# Patient Record
Sex: Male | Born: 1960 | Race: White | Hispanic: No | Smoking: Current every day smoker
Health system: Southern US, Community
[De-identification: ages and names within clinical notes are randomized; demographics above are authoritative.]

## PROBLEM LIST (undated history)

## (undated) DIAGNOSIS — I10 Essential (primary) hypertension: Secondary | ICD-10-CM

## (undated) DIAGNOSIS — E7800 Pure hypercholesterolemia, unspecified: Secondary | ICD-10-CM

## (undated) DIAGNOSIS — I2510 Atherosclerotic heart disease of native coronary artery without angina pectoris: Secondary | ICD-10-CM

## (undated) DIAGNOSIS — M720 Palmar fascial fibromatosis [Dupuytren]: Secondary | ICD-10-CM

## (undated) DIAGNOSIS — F17200 Nicotine dependence, unspecified, uncomplicated: Secondary | ICD-10-CM

## (undated) DIAGNOSIS — J449 Chronic obstructive pulmonary disease, unspecified: Secondary | ICD-10-CM

## (undated) DIAGNOSIS — E538 Deficiency of other specified B group vitamins: Secondary | ICD-10-CM

## (undated) DIAGNOSIS — F1010 Alcohol abuse, uncomplicated: Secondary | ICD-10-CM

## (undated) DIAGNOSIS — I7090 Unspecified atherosclerosis: Secondary | ICD-10-CM

## (undated) DIAGNOSIS — N529 Male erectile dysfunction, unspecified: Secondary | ICD-10-CM

## (undated) DIAGNOSIS — G629 Polyneuropathy, unspecified: Secondary | ICD-10-CM

## (undated) DIAGNOSIS — Z860100 Personal history of colon polyps, unspecified: Secondary | ICD-10-CM

## (undated) DIAGNOSIS — J439 Emphysema, unspecified: Secondary | ICD-10-CM

## (undated) HISTORY — DX: Atherosclerotic heart disease of native coronary artery without angina pectoris: I25.10

## (undated) HISTORY — DX: Unspecified atherosclerosis: I70.90

## (undated) HISTORY — DX: Polyneuropathy, unspecified: G62.9

## (undated) HISTORY — DX: Nicotine dependence, unspecified, uncomplicated: F17.200

## (undated) HISTORY — PX: SPLENECTOMY, TOTAL: SHX788

## (undated) HISTORY — DX: Alcohol abuse, uncomplicated: F10.10

## (undated) HISTORY — DX: Personal history of colon polyps, unspecified: Z86.0100

## (undated) HISTORY — DX: Pure hypercholesterolemia, unspecified: E78.00

## (undated) HISTORY — DX: Male erectile dysfunction, unspecified: N52.9

## (undated) HISTORY — DX: Deficiency of other specified B group vitamins: E53.8

## (undated) HISTORY — DX: Emphysema, unspecified: J43.9

---

## 1999-11-24 ENCOUNTER — Encounter: Admission: RE | Admit: 1999-11-24 | Discharge: 1999-11-24 | Payer: Self-pay | Admitting: Family Medicine

## 1999-11-24 ENCOUNTER — Encounter: Payer: Self-pay | Admitting: Family Medicine

## 2011-06-30 ENCOUNTER — Other Ambulatory Visit: Payer: Self-pay | Admitting: Physician Assistant

## 2011-06-30 ENCOUNTER — Ambulatory Visit
Admission: RE | Admit: 2011-06-30 | Discharge: 2011-06-30 | Disposition: A | Discharge status: Home / Self Care | Payer: 59 | Source: Ambulatory Visit | Attending: Physician Assistant | Admitting: Physician Assistant

## 2011-06-30 DIAGNOSIS — R05 Cough: Secondary | ICD-10-CM

## 2011-06-30 DIAGNOSIS — R0602 Shortness of breath: Secondary | ICD-10-CM

## 2011-06-30 DIAGNOSIS — R059 Cough, unspecified: Secondary | ICD-10-CM

## 2011-07-14 ENCOUNTER — Ambulatory Visit
Admission: RE | Admit: 2011-07-14 | Discharge: 2011-07-14 | Disposition: A | Discharge status: Home / Self Care | Payer: 59 | Source: Ambulatory Visit | Attending: Physician Assistant | Admitting: Physician Assistant

## 2011-07-14 ENCOUNTER — Other Ambulatory Visit: Payer: Self-pay | Admitting: Physician Assistant

## 2011-07-14 DIAGNOSIS — R059 Cough, unspecified: Secondary | ICD-10-CM

## 2011-07-14 DIAGNOSIS — R05 Cough: Secondary | ICD-10-CM

## 2011-07-28 ENCOUNTER — Other Ambulatory Visit: Payer: Self-pay | Admitting: Physician Assistant

## 2011-07-28 ENCOUNTER — Ambulatory Visit
Admission: RE | Admit: 2011-07-28 | Discharge: 2011-07-28 | Disposition: A | Discharge status: Home / Self Care | Payer: 59 | Source: Ambulatory Visit | Attending: Physician Assistant | Admitting: Physician Assistant

## 2011-07-28 DIAGNOSIS — R0602 Shortness of breath: Secondary | ICD-10-CM

## 2016-02-25 DIAGNOSIS — I10 Essential (primary) hypertension: Secondary | ICD-10-CM | POA: Diagnosis not present

## 2016-02-25 DIAGNOSIS — Z720 Tobacco use: Secondary | ICD-10-CM | POA: Diagnosis not present

## 2016-05-08 DIAGNOSIS — R21 Rash and other nonspecific skin eruption: Secondary | ICD-10-CM | POA: Diagnosis not present

## 2016-05-26 DIAGNOSIS — E7800 Pure hypercholesterolemia, unspecified: Secondary | ICD-10-CM | POA: Diagnosis not present

## 2016-08-25 DIAGNOSIS — J449 Chronic obstructive pulmonary disease, unspecified: Secondary | ICD-10-CM | POA: Diagnosis not present

## 2016-08-25 DIAGNOSIS — Z125 Encounter for screening for malignant neoplasm of prostate: Secondary | ICD-10-CM | POA: Diagnosis not present

## 2016-08-25 DIAGNOSIS — I10 Essential (primary) hypertension: Secondary | ICD-10-CM | POA: Diagnosis not present

## 2016-08-25 DIAGNOSIS — Z0000 Encounter for general adult medical examination without abnormal findings: Secondary | ICD-10-CM | POA: Diagnosis not present

## 2016-08-25 DIAGNOSIS — E7800 Pure hypercholesterolemia, unspecified: Secondary | ICD-10-CM | POA: Diagnosis not present

## 2017-03-09 DIAGNOSIS — E7800 Pure hypercholesterolemia, unspecified: Secondary | ICD-10-CM | POA: Diagnosis not present

## 2017-03-09 DIAGNOSIS — J449 Chronic obstructive pulmonary disease, unspecified: Secondary | ICD-10-CM | POA: Diagnosis not present

## 2017-03-09 DIAGNOSIS — I10 Essential (primary) hypertension: Secondary | ICD-10-CM | POA: Diagnosis not present

## 2017-08-31 DIAGNOSIS — E7800 Pure hypercholesterolemia, unspecified: Secondary | ICD-10-CM | POA: Diagnosis not present

## 2017-08-31 DIAGNOSIS — Z125 Encounter for screening for malignant neoplasm of prostate: Secondary | ICD-10-CM | POA: Diagnosis not present

## 2017-08-31 DIAGNOSIS — I10 Essential (primary) hypertension: Secondary | ICD-10-CM | POA: Diagnosis not present

## 2017-08-31 DIAGNOSIS — Z0000 Encounter for general adult medical examination without abnormal findings: Secondary | ICD-10-CM | POA: Diagnosis not present

## 2017-09-01 ENCOUNTER — Encounter: Payer: Self-pay | Admitting: Emergency Medicine

## 2017-09-01 ENCOUNTER — Emergency Department
Admission: EM | Admit: 2017-09-01 | Discharge: 2017-09-01 | Disposition: A | Discharge status: Home / Self Care | Payer: Commercial Managed Care - PPO | Attending: Emergency Medicine | Admitting: Emergency Medicine

## 2017-09-01 ENCOUNTER — Other Ambulatory Visit: Payer: Self-pay

## 2017-09-01 DIAGNOSIS — R0602 Shortness of breath: Secondary | ICD-10-CM | POA: Diagnosis not present

## 2017-09-01 DIAGNOSIS — I10 Essential (primary) hypertension: Secondary | ICD-10-CM | POA: Diagnosis not present

## 2017-09-01 DIAGNOSIS — J449 Chronic obstructive pulmonary disease, unspecified: Secondary | ICD-10-CM | POA: Insufficient documentation

## 2017-09-01 DIAGNOSIS — T782XXA Anaphylactic shock, unspecified, initial encounter: Secondary | ICD-10-CM | POA: Diagnosis not present

## 2017-09-01 DIAGNOSIS — T63461A Toxic effect of venom of wasps, accidental (unintentional), initial encounter: Secondary | ICD-10-CM | POA: Diagnosis not present

## 2017-09-01 DIAGNOSIS — T7840XA Allergy, unspecified, initial encounter: Secondary | ICD-10-CM | POA: Diagnosis not present

## 2017-09-01 DIAGNOSIS — F17210 Nicotine dependence, cigarettes, uncomplicated: Secondary | ICD-10-CM | POA: Insufficient documentation

## 2017-09-01 HISTORY — DX: Chronic obstructive pulmonary disease, unspecified: J44.9

## 2017-09-01 HISTORY — DX: Essential (primary) hypertension: I10

## 2017-09-01 MED ORDER — SODIUM CHLORIDE 0.9 % IV BOLUS
1000.0000 mL | Freq: Once | INTRAVENOUS | Status: AC
  Administered 2017-09-01: 1000 mL via INTRAVENOUS

## 2017-09-01 MED ORDER — FAMOTIDINE 20 MG PO TABS: 20.0000 mg | ORAL_TABLET | Freq: Every day | ORAL | 0 refills | Status: DC

## 2017-09-01 MED ORDER — PREDNISONE 20 MG PO TABS: 60.0000 mg | ORAL_TABLET | Freq: Every day | ORAL | 0 refills | Status: AC

## 2017-09-01 MED ORDER — FAMOTIDINE IN NACL 20-0.9 MG/50ML-% IV SOLN
20.0000 mg | Freq: Once | INTRAVENOUS | Status: AC
  Administered 2017-09-01: 20 mg via INTRAVENOUS

## 2017-09-01 MED ORDER — EPINEPHRINE 0.3 MG/0.3ML IJ SOAJ: 0.3000 mg | Freq: Once | INTRAMUSCULAR | 1 refills | Status: AC

## 2017-09-01 NOTE — ED Provider Notes (Signed)
[facility] Emergency Department Provider Note  ____________________________________________  Time seen: Approximately 11:46 AM  I have reviewed the triage vital signs and the nursing notes.   HISTORY  Chief Complaint Allergic Reaction   HPI Timothy Terry Sr. is a 57 y.o. male for a history of COPD and hypertension who presents from urgent care for concerns of an anaphylactic reaction.  Patient was stung by 2 yellow jackets on his right arm and left ear this morning. He developed sudden onset of SOB and swelling of the ear and hives.  He was taken to urgent care.  Upon arrival patient was hemodynamically stable and he was given Decadron and Benadryl.  Right after that patient became hypotensive with systolic in the 60s and very lightheaded.  While patient was hypotensive he was noted to have asymmetry of his mouth and was drooling. He received epipen with improvement of his BP and was sent to the ED. Upon arrival, patient is very jittery, covered in hives, HD stable and neuro intact. He does have mild slurred speech. He denies throat closing sensation, tongue swelling, difficulty swallowing or breathing.   Past Medical History:  Diagnosis Date  . COPD (chronic obstructive pulmonary disease) (HCC)   . Hypertension     Past Surgical History:  Procedure Laterality Date  . SPLENECTOMY, TOTAL     at 57 years of age s/p fall from building    Prior to Admission medications   Medication Sig Start Date End Date Taking? Authorizing Provider  EPINEPHrine 0.3 mg/0.3 mL IJ SOAJ injection Inject 0.3 mLs (0.3 mg total) into the muscle once for 1 dose. 09/01/17 09/01/17  Nita Sickle, MD  famotidine (PEPCID) 20 MG tablet Take 1 tablet (20 mg total) by mouth daily for 4 days. 09/01/17 09/05/17  Nita Sickle, MD  predniSONE (DELTASONE) 20 MG tablet Take 3 tablets (60 mg total) by mouth daily for 4 days. 09/01/17 09/05/17  Nita Sickle, MD    Allergies Bee  venom  No family history on file.  Social History Social History   Tobacco Use  . Smoking status: Current Every Day Smoker    Packs/day: 1.00    Types: Cigarettes  . Smokeless tobacco: Never Used  Substance Use Topics  . Alcohol use: Yes    Alcohol/week: 7.2 oz    Types: 12 Cans of beer per week  . Drug use: Yes    Frequency: 1.0 times per week    Types: Marijuana    Review of Systems  Constitutional: Negative for fever. + dizziness Eyes: Negative for visual changes. ENT: Negative for sore throat. Neck: No neck pain  Cardiovascular: Negative for chest pain. Respiratory: + shortness of breath. Gastrointestinal: Negative for abdominal pain, vomiting or diarrhea. Genitourinary: Negative for dysuria. Musculoskeletal: Negative for back pain. Skin: + hives Neurological: Negative for headaches, weakness or numbness. Psych: No SI or HI  ____________________________________________   PHYSICAL EXAM:  VITAL SIGNS: Vitals:   09/01/17 1300 09/01/17 1330  BP: 123/83 129/83  Pulse: 75 73  Resp: 15 19  Temp:    SpO2: 98% 96%   Constitutional: Alert and oriented. Well appearing and in no apparent distress. HEENT:      Head: Normocephalic and atraumatic.         Eyes: Conjunctivae are normal. Sclera is non-icteric.       Mouth/Throat: Mucous membranes are moist.  Tongue is normal size with no swelling, uvula is midline with no swelling, there is no stridor, airways patent,  no angioedema      Ear: L pinna is swollen, ear canal is clear      Neck: Supple with no signs of meningismus. Cardiovascular: Regular rate and rhythm. No murmurs, gallops, or rubs. 2+ symmetrical distal pulses are present in all extremities. No JVD. Respiratory: Normal respiratory effort. Lungs are clear to auscultation bilaterally. No wheezes, crackles, or rhonchi.  Gastrointestinal: Soft, non tender, and non distended with positive bowel sounds. No rebound or guarding. Musculoskeletal: Nontender with  normal range of motion in all extremities. No edema, cyanosis, or erythema of extremities. Neurologic: Normal speech and language. Face is symmetric. Moving all extremities. No gross focal neurologic deficits are appreciated. Skin: Skin is warm, dry and intact. Diffuse hives on the torso Psychiatric: Mood and affect are normal. Speech and behavior are normal.  ____________________________________________   LABS (all labs ordered are listed, but only abnormal results are displayed)  Labs Reviewed - No data to display ____________________________________________  EKG  ED ECG REPORT I, Nita Sickle, the attending physician, personally viewed and interpreted this ECG.  Normal sinus rhythm with frequent PACs, rate of 85, normal intervals, normal axis, no ST elevations or depressions.  No prior for comparison. ____________________________________________  RADIOLOGY  none  ____________________________________________   PROCEDURES  Procedure(s) performed: None Procedures Critical Care performed:  None ____________________________________________   INITIAL IMPRESSION / ASSESSMENT AND PLAN / ED COURSE  57 y.o. male for a history of COPD and hypertension who presents from urgent care for concerns of an anaphylactic reaction.  Patient with severe anaphylactic reaction including hives, difficulty breathing, and hypotension.  Arrives hemodynamically stable after receiving EpiPen.  There was some concern the patient had drooping of his mouth and drooling when his blood pressure was low at urgent care however upon arrival to the emergency room patient is completely neurologically intact with no neuro deficits.  Symptoms most likely due to severe hypotension.  Will monitor patient closely.  At this time he only has hives.  There is no stridor, airway is patent, no difficulty breathing.  Telemetry showing irregular heart rhythm therefore an EKG was performed which shows frequent PACs again  most likely from epinephrine.  Patient has no chest pain.  No obvious ischemic changes.  Will give IV fluids, IV Pepcid and monitor patient closely.  Clinical Course as of Sep 01 1425  Sat Sep 01, 2017  1425 Patient was monitored for hours post EpiPen with no recurrence of his symptoms.  He remains extremely well-appearing with full resolution of his anaphylaxis.  Patient will be given a prescription for an EpiPen.  Discussed with him the importance of keeping an EpiPen with him at all times and discuss signs and symptoms to administer the EpiPen.  Patient will also be given a prescription for steroids for 4 more days.  Discussed return precautions for any recurrent signs of an allergic reaction.  Patient to follow-up with his primary care doctor in 2 to 3 days.   [CV]    Clinical Course User Index [CV] Don Perking [place], MD     As part of my medical decision making, I reviewed the following data within the electronic MEDICAL RECORD NUMBER Nursing notes reviewed and incorporated, EKG interpreted , Notes from prior ED visits and [place] Controlled Substance Database    Pertinent labs & imaging results that were available during my care of the patient were reviewed by me and considered in my medical decision making (see chart for details).    ____________________________________________   FINAL CLINICAL  IMPRESSION(S) / ED DIAGNOSES  Final diagnoses:  Anaphylaxis, initial encounter      NEW MEDICATIONS STARTED DURING THIS VISIT:  ED Discharge Orders        Ordered    predniSONE (DELTASONE) 20 MG tablet  Daily     09/01/17 1426    famotidine (PEPCID) 20 MG tablet  Daily     09/01/17 1426    EPINEPHrine 0.3 mg/0.3 mL IJ SOAJ injection   Once     09/01/17 1427       Note:  This document was prepared using Dragon voice recognition software and may include unintentional dictation errors.    Don PerkingVeronese, [place], MD 09/01/17 [id number]

## 2017-09-01 NOTE — ED Notes (Signed)
Pt ambulatory upon discharge; declined wheel chair. Verbalized understanding of discharge instructions, follow-up care and prescriptions. VSS. Skin warm and dry. A&O x4.  

## 2017-09-01 NOTE — ED Notes (Signed)
ED Provider at bedside. 

## 2017-09-01 NOTE — ED Triage Notes (Addendum)
[facility] s/p getting stung by yellow jackets (one on top of left arm and right arm). Started having hives and breathing difficulty there. Pt's BP dropped to 60s systollically. Left ear purple and very swollen at this time. Pt received 25 mg benedryl, 10 mg decadron & epi pen at urgent care. Pt A&O at this time.  Pt having slurred speech at this time. Denies any swelling feeling in throat or tongue. Pt left side of mouth is drooping. Smile asymmetrical. Grip strength strong and equal bilateral. Urgent care reported left arm weakness.   Abdominal rash present.

## 2017-10-15 DIAGNOSIS — R05 Cough: Secondary | ICD-10-CM | POA: Diagnosis not present

## 2017-10-15 DIAGNOSIS — R29810 Facial weakness: Secondary | ICD-10-CM | POA: Diagnosis not present

## 2017-10-15 DIAGNOSIS — F17200 Nicotine dependence, unspecified, uncomplicated: Secondary | ICD-10-CM | POA: Diagnosis not present

## 2017-10-15 DIAGNOSIS — J449 Chronic obstructive pulmonary disease, unspecified: Secondary | ICD-10-CM | POA: Diagnosis not present

## 2017-10-15 DIAGNOSIS — G510 Bell's palsy: Secondary | ICD-10-CM | POA: Diagnosis not present

## 2017-11-22 DIAGNOSIS — S61512A Laceration without foreign body of left wrist, initial encounter: Secondary | ICD-10-CM | POA: Diagnosis not present

## 2017-11-22 DIAGNOSIS — W268XXA Contact with other sharp object(s), not elsewhere classified, initial encounter: Secondary | ICD-10-CM | POA: Diagnosis not present

## 2017-11-22 DIAGNOSIS — Z23 Encounter for immunization: Secondary | ICD-10-CM | POA: Diagnosis not present

## 2017-11-29 DIAGNOSIS — Z4802 Encounter for removal of sutures: Secondary | ICD-10-CM | POA: Diagnosis not present

## 2018-03-29 DIAGNOSIS — J449 Chronic obstructive pulmonary disease, unspecified: Secondary | ICD-10-CM | POA: Diagnosis not present

## 2018-03-29 DIAGNOSIS — I10 Essential (primary) hypertension: Secondary | ICD-10-CM | POA: Diagnosis not present

## 2018-03-29 DIAGNOSIS — E7800 Pure hypercholesterolemia, unspecified: Secondary | ICD-10-CM | POA: Diagnosis not present

## 2018-04-06 DIAGNOSIS — J449 Chronic obstructive pulmonary disease, unspecified: Secondary | ICD-10-CM | POA: Diagnosis not present

## 2018-04-06 DIAGNOSIS — J9801 Acute bronchospasm: Secondary | ICD-10-CM | POA: Diagnosis not present

## 2018-04-06 DIAGNOSIS — J209 Acute bronchitis, unspecified: Secondary | ICD-10-CM | POA: Diagnosis not present

## 2019-06-02 ENCOUNTER — Telehealth: Payer: Self-pay | Admitting: *Deleted

## 2019-06-06 ENCOUNTER — Other Ambulatory Visit: Payer: Self-pay | Admitting: *Deleted

## 2019-06-06 DIAGNOSIS — F17210 Nicotine dependence, cigarettes, uncomplicated: Secondary | ICD-10-CM

## 2019-06-06 NOTE — Telephone Encounter (Signed)
Spoke with pt and scheduled SDMV 06/20/19 1:30 CT ordered Nothing further needed

## 2019-06-06 NOTE — Telephone Encounter (Signed)
Timothy Terry have you spoke with this patient?

## 2019-06-06 NOTE — Telephone Encounter (Signed)
LMTC x 1 to discuss lung screening

## 2019-06-20 ENCOUNTER — Other Ambulatory Visit: Payer: Self-pay

## 2019-06-20 ENCOUNTER — Ambulatory Visit (INDEPENDENT_AMBULATORY_CARE_PROVIDER_SITE_OTHER)
Admission: RE | Admit: 2019-06-20 | Discharge: 2019-06-20 | Disposition: A | Discharge status: Home / Self Care | Payer: Commercial Managed Care - PPO | Source: Ambulatory Visit | Attending: Acute Care | Admitting: Acute Care

## 2019-06-20 ENCOUNTER — Encounter: Payer: Self-pay | Admitting: Primary Care

## 2019-06-20 ENCOUNTER — Ambulatory Visit (INDEPENDENT_AMBULATORY_CARE_PROVIDER_SITE_OTHER): Payer: Commercial Managed Care - PPO | Admitting: Primary Care

## 2019-06-20 VITALS — BP 142/82 | HR 86 | Temp 98.6°F | Ht 76.0 in | Wt 213.4 lb

## 2019-06-20 DIAGNOSIS — Z87891 Personal history of nicotine dependence: Secondary | ICD-10-CM | POA: Diagnosis not present

## 2019-06-20 DIAGNOSIS — F17200 Nicotine dependence, unspecified, uncomplicated: Secondary | ICD-10-CM

## 2019-06-20 DIAGNOSIS — F17210 Nicotine dependence, cigarettes, uncomplicated: Secondary | ICD-10-CM

## 2019-06-20 NOTE — Progress Notes (Signed)
Shared Decision Making Visit Lung Cancer Screening Program [phone])   Eligibility:  Age 60 y.o.  Pack Years Smoking History Calculation 45 (# packs/per year x # years smoked)  Recent History of coughing up blood  no  Unexplained weight loss? no ( >Than 15 pounds within the last 6 months )  Prior History Lung / other cancer no (Diagnosis within the last 5 years already requiring surveillance chest CT Scans).  Smoking Status Current Smoker  Visit Components:  Discussion included one or more decision making aids. yes  Discussion included risk/benefits of screening. yes  Discussion included potential follow up diagnostic testing for abnormal scans. yes  Discussion included meaning and risk of over diagnosis. yes  Discussion included meaning and risk of False Positives. yes  Discussion included meaning of total radiation exposure. yes  Counseling Included:  Importance of adherence to annual lung cancer LDCT screening. yes  Impact of comorbidities on ability to participate in the program. yes  Ability and willingness to under diagnostic treatment. yes  Smoking Cessation Counseling:  Current Smokers:   Discussed importance of smoking cessation. yes  Information about tobacco cessation classes and interventions provided to patient. yes  Patient provided with "ticket" for LDCT Scan. yes  Symptomatic Patient. yes  Counseling(Intermediate counseling: > three minutes) [id number]  Diagnosis Code: Tobacco Use Z72.0  Asymptomatic Patient yes  Counseling (Intermediate counseling: > three minutes counseling) M5465  Former Smokers:   Discussed the importance of maintaining cigarette abstinence. yes  Diagnosis Code: Personal History of Nicotine Dependence. K35.465  Information about tobacco cessation classes and interventions provided to patient. Yes  Patient provided with "ticket" for LDCT Scan. yes  Written Order for Lung Cancer Screening with LDCT placed in Epic.  Yes (CT Chest Lung Cancer Screening Low Dose W/O CM) [id number] Z12.2-Screening of respiratory organs Z87.891-Personal history of nicotine dependence   BP (!) 142/82 (BP Location: Right Arm, Cuff Size: Normal)   Pulse 86   Temp 98.6 F (37 C) (Temporal)   Ht 6\' 4"  (1.93 m)   Wt 213 lb 6.4 oz (96.8 kg)   SpO2 96%   BMI 25.98 kg/m    I have spent 25 minutes of face to face time with Mr. Morken discussing the risks and benefits of lung cancer screening. We viewed a power point together that explained in detail the above noted topics. We paused at intervals to allow for questions to be asked and answered to ensure understanding.We discussed that the single most powerful action that he can take to decrease his risk of developing lung cancer is to quit smoking. We discussed whether or not he is ready to commit to setting a quit date. We discussed options for tools to aid in quitting smoking including nicotine replacement therapy, non-nicotine medications, support groups, Quit Smart classes, and behavior modification. We discussed that often times setting smaller, more achievable goals, such as eliminating 1 cigarette a day for a week and then 2 cigarettes a day for a week can be helpful in slowly decreasing the number of cigarettes smoked. This allows for a sense of accomplishment as well as providing a clinical benefit. I gave him the " Be Stronger Than Your Excuses" card with contact information for community resources, classes, free nicotine replacement therapy, and access to mobile apps, text messaging, and on-line smoking cessation help. I have also given him Jeanella Anton card and contact information in the event he needs to contact me. We discussed the time and location of the scan, and  that either Doroteo Glassman RN or I will call with the results within 24-48 hours of receiving them. I have offered him  a copy of the power point we viewed  as a resource in the event they need reinforcement of the  concepts we discussed today in the office. The patient verbalized understanding of all of  the above and had no further questions upon leaving the office. They have my contact information in the event they have any further questions.  I spent 3-5 minutes counseling on smoking cessation and the health risks of continued tobacco abuse.  I explained to the patient that there has been a high incidence of coronary artery disease noted on these exams. I explained that this is a non-gated exam therefore degree or severity cannot be determined. This patient is on statin therapy. I have asked the patient to follow-up with their PCP regarding any incidental finding of coronary artery disease and management with diet or medication as their PCP  feels is clinically indicated. The patient verbalized understanding of the above and had no further questions upon completion of the visit.  Current tobacco smoker and daily marijuana use. No vaping. Family hx of lung cancer. He is on cholesterol medication. He has a history of a collapse lung at 46 from a fall and pneumonia age 1.   Martyn Ehrich, NP

## 2019-06-20 NOTE — Patient Instructions (Addendum)
Thank you for participating in the [facility] Cancer Screening Program. It was our pleasure to meet you today. We will call you with the results of your scan within the next few days. Your scan will be assigned a Lung RADS category score by the physicians reading the scans.  This Lung RADS score determines follow up scanning.  See below for description of categories, and follow up screening recommendations. We will be in touch to schedule your follow up screening annually or based on recommendations of our providers. We will fax a copy of your scan results to your Primary Care Physician, or the physician who referred you to the program, to ensure they have the results. Please call the office if you have any questions or concerns regarding your scanning experience or results.  Our office number is [phone]. Please speak with Denise Phelps, RN. She is our Lung Cancer Screening RN. If she is unavailable when you call, please have the office staff send her a message. She will return your call at her earliest convenience. Remember, if your scan is normal, we will scan you annually as long as you continue to meet the criteria for the program. (Age 55-77, Current smoker or smoker who has quit within the last 15 years). If you are a smoker, remember, quitting is the single most powerful action that you can take to decrease your risk of lung cancer and other pulmonary, breathing related problems. We know quitting is hard, and we are here to help.  Please let us know if there is anything we can do to help you meet your goal of quitting. If you are a former smoker, congratulations. We are proud of you! Remain smoke free! Remember you can refer friends or family members through the number above.  We will screen them to make sure they meet criteria for the program. Thank you for helping us take better care of you by participating in Lung Screening.  Lung RADS Categories:  Lung RADS 1: no nodules  or definitely non-concerning nodules.  Recommendation is for a repeat annual scan in 12 months.  Lung RADS 2:  nodules that are non-concerning in appearance and behavior with a very low likelihood of becoming an active cancer. Recommendation is for a repeat annual scan in 12 months.  Lung RADS 3: nodules that are probably non-concerning , includes nodules with a low likelihood of becoming an active cancer.  Recommendation is for a 6-month repeat screening scan. Often noted after an upper respiratory illness. We will be in touch to make sure you have no questions, and to schedule your 6-month scan.  Lung RADS 4 A: nodules with concerning findings, recommendation is most often for a follow up scan in 3 months or additional testing based on our provider's assessment of the scan. We will be in touch to make sure you have no questions and to schedule the recommended 3 month follow up scan.  Lung RADS 4 B:  indicates findings that are concerning. We will be in touch with you to schedule additional diagnostic testing based on our provider's  assessment of the scan.   

## 2019-06-23 ENCOUNTER — Telehealth: Payer: Self-pay | Admitting: Acute Care

## 2019-06-23 NOTE — Telephone Encounter (Signed)
This will get called through the lung cancer screening program. Thanks so much

## 2019-06-23 NOTE — Telephone Encounter (Signed)
Received call report from [facility] with [facility] on patient's Low dose CT done on 06/20/19 . SG please review the result/impression copied below:  IMPRESSION: 1. Nodular lesion in the apical left upper lobe. Scarring is strongly favored. By size criteria and because malignancy cannot be definitively excluded, lesion is characterized as Lung-RADS 4A, suspicious. Follow up low-dose chest CT without contrast in 3 months (please use the following order, "CT CHEST LCS NODULE FOLLOW-UP W/O CM") is recommended. Alternatively, PET may be considered when there is a solid component 75mm or larger. These results will be called to the ordering clinician or representative by the Radiologist Assistant, and communication documented in the PACS or [employer]. 2. Aortic atherosclerosis (ICD10-I70.0). Coronary artery calcification. 3.  Emphysema (ICD10-J43.9).    Please advise, thank you.

## 2019-06-26 ENCOUNTER — Other Ambulatory Visit: Payer: Self-pay | Admitting: *Deleted

## 2019-06-26 DIAGNOSIS — F17210 Nicotine dependence, cigarettes, uncomplicated: Secondary | ICD-10-CM

## 2019-06-26 NOTE — Progress Notes (Signed)
I called to give the patient the results of his low dose CT. There was no answer. I have left a HIPPA compliant message with the office contact number and I have requested that the patient call to discuss his low dose CT results.   Denise please place order for 3 month follow up low dose CT and fax results to PCP. Let them know we are doing a 3 month follow up.

## 2019-07-02 ENCOUNTER — Telehealth: Payer: Self-pay | Admitting: Acute Care

## 2019-07-02 NOTE — Telephone Encounter (Signed)
Called and spoke with patient. He was called back in regards to his LCS results. Will route to the LCS pool.

## 2019-07-03 NOTE — Telephone Encounter (Signed)
I have called the patient with the results of his LDCT. I explained that his scan had been read as a Lung RADS 4 A : suspicious findings, either short term follow up in 3 months or alternatively  PET Scan evaluation may be considered when there is a solid component of  8 mm or larger.I also explained that scarring is strongly favored. The patient did have a significant pneumonia in the past, and had been told he had scarring as a result. I explained that we will do a 3 month follow up scan to ensure stability to support scarring as the diagnosis. He is in agreement with this. We will do a follow up scan 09/2019. I told him we will call closer to the time to get him scheduled. He verbalized understanding.  Angelique Blonder, please fax results to PCP and order 3 month follow up scan for 09/2019.  Synetta Fail, I wanted you to know the follow up date.  Thanks everyone for all you do!!

## 2019-07-03 NOTE — Telephone Encounter (Signed)
Results were faxed to PCP. Order was placed for 3 mth f/u CT.

## 2019-07-03 NOTE — Telephone Encounter (Signed)
Timothy Terry, pt has called back regarding CT results.

## 2019-07-07 NOTE — Progress Notes (Signed)
I attempted to call the patient with the results of his LDCT again today. There was no answer. We will attempt to call the patient again this week if he does not respond to our previous telephone message to call the office.

## 2019-07-10 NOTE — Progress Notes (Signed)
I have called the patient with the results. I have explained that we will do a repeat CT in 3 months to ensure stability of the nodule. He verbalized understanding and is in agreement with the plan. Angelique Blonder, I think you have already placed the order for the 3 month follow up. Please fax results to PCP if you have not already done so. Thanks so much

## 2019-07-28 ENCOUNTER — Other Ambulatory Visit: Payer: Self-pay | Admitting: Orthopedic Surgery

## 2019-08-18 ENCOUNTER — Encounter (HOSPITAL_BASED_OUTPATIENT_CLINIC_OR_DEPARTMENT_OTHER): Payer: Self-pay | Admitting: Orthopedic Surgery

## 2019-08-18 ENCOUNTER — Other Ambulatory Visit: Payer: Self-pay

## 2019-08-26 ENCOUNTER — Ambulatory Visit (HOSPITAL_BASED_OUTPATIENT_CLINIC_OR_DEPARTMENT_OTHER)
Admission: RE | Admit: 2019-08-26 | Payer: Commercial Managed Care - PPO | Source: Home / Self Care | Admitting: Orthopedic Surgery

## 2019-08-26 HISTORY — DX: Nicotine dependence, unspecified, uncomplicated: F17.200

## 2019-08-26 HISTORY — DX: Palmar fascial fibromatosis (dupuytren): M72.0

## 2019-08-26 SURGERY — RELEASE, DUPUYTREN CONTRACTURE
Anesthesia: Choice | Laterality: Right

## 2019-09-26 ENCOUNTER — Ambulatory Visit (INDEPENDENT_AMBULATORY_CARE_PROVIDER_SITE_OTHER)
Admission: RE | Admit: 2019-09-26 | Discharge: 2019-09-26 | Disposition: A | Discharge status: Home / Self Care | Payer: Commercial Managed Care - PPO | Source: Ambulatory Visit | Attending: Acute Care | Admitting: Acute Care

## 2019-09-26 ENCOUNTER — Other Ambulatory Visit: Payer: Self-pay

## 2019-09-26 DIAGNOSIS — F17210 Nicotine dependence, cigarettes, uncomplicated: Secondary | ICD-10-CM | POA: Diagnosis not present

## 2019-09-26 NOTE — Progress Notes (Signed)
Please call patient and let them  know their  low dose Ct was read as a Lung RADS 2: nodules that are benign in appearance and behavior with a very low likelihood of becoming a clinically active cancer due to size or lack of growth. Recommendation per radiology is for a repeat LDCT in 12 months. .Please let them  know we will order and schedule their  annual screening scan for 09/2020. Please let them  know there was notation of CAD on their  scan.  Please remind the patient  that this is a non-gated exam therefore degree or severity of disease  cannot be determined. Please have them  follow up with their PCP regarding potential risk factor modification, dietary therapy or pharmacologic therapy if clinically indicated. Pt.  is  currently on statin therapy. Please place order for annual  screening scan for  09/2020 and fax results to PCP. Thanks so much. 

## 2019-09-29 ENCOUNTER — Other Ambulatory Visit: Payer: Self-pay | Admitting: *Deleted

## 2019-09-29 DIAGNOSIS — F17210 Nicotine dependence, cigarettes, uncomplicated: Secondary | ICD-10-CM

## 2020-01-07 ENCOUNTER — Other Ambulatory Visit: Payer: Self-pay | Admitting: Orthopedic Surgery

## 2020-02-04 ENCOUNTER — Other Ambulatory Visit: Payer: Self-pay

## 2020-02-04 ENCOUNTER — Encounter (HOSPITAL_BASED_OUTPATIENT_CLINIC_OR_DEPARTMENT_OTHER): Payer: Self-pay | Admitting: Orthopedic Surgery

## 2020-02-10 ENCOUNTER — Encounter (HOSPITAL_BASED_OUTPATIENT_CLINIC_OR_DEPARTMENT_OTHER)
Admission: RE | Admit: 2020-02-10 | Discharge: 2020-02-10 | Disposition: A | Discharge status: Home / Self Care | Payer: Commercial Managed Care - PPO | Source: Ambulatory Visit | Attending: Orthopedic Surgery | Admitting: Orthopedic Surgery

## 2020-02-10 ENCOUNTER — Other Ambulatory Visit (HOSPITAL_COMMUNITY)
Admission: RE | Admit: 2020-02-10 | Discharge: 2020-02-10 | Disposition: A | Discharge status: Home / Self Care | Payer: Commercial Managed Care - PPO | Source: Ambulatory Visit | Attending: Orthopedic Surgery | Admitting: Orthopedic Surgery

## 2020-02-10 DIAGNOSIS — Z20822 Contact with and (suspected) exposure to covid-19: Secondary | ICD-10-CM | POA: Diagnosis not present

## 2020-02-10 DIAGNOSIS — Z01818 Encounter for other preprocedural examination: Secondary | ICD-10-CM | POA: Insufficient documentation

## 2020-02-10 DIAGNOSIS — Z01812 Encounter for preprocedural laboratory examination: Secondary | ICD-10-CM | POA: Insufficient documentation

## 2020-02-10 LAB — SARS CORONAVIRUS 2 (TAT 6-24 HRS): SARS Coronavirus 2: NEGATIVE

## 2020-02-10 LAB — BASIC METABOLIC PANEL
Anion gap: 8 (ref 5–15)
BUN: 8 mg/dL (ref 6–20)
CO2: 28 mmol/L (ref 22–32)
Calcium: 9.7 mg/dL (ref 8.9–10.3)
Chloride: 102 mmol/L (ref 98–111)
Creatinine, Ser: 1.02 mg/dL (ref 0.61–1.24)
GFR, Estimated: >60 mL/min (ref >60)
Glucose, Bld: 109 mg/dL — ABNORMAL HIGH (ref 70–99)
Potassium: 4.4 mmol/L (ref 3.5–5.1)
Sodium: 138 mmol/L (ref 135–145)

## 2020-02-10 NOTE — Progress Notes (Signed)

## 2020-02-13 ENCOUNTER — Ambulatory Visit (HOSPITAL_BASED_OUTPATIENT_CLINIC_OR_DEPARTMENT_OTHER): Payer: Commercial Managed Care - PPO | Admitting: Anesthesiology

## 2020-02-13 ENCOUNTER — Encounter (HOSPITAL_BASED_OUTPATIENT_CLINIC_OR_DEPARTMENT_OTHER)
Admission: RE | Disposition: A | Discharge status: Home / Self Care | Payer: Self-pay | Source: Home / Self Care | Attending: Orthopedic Surgery

## 2020-02-13 ENCOUNTER — Ambulatory Visit (HOSPITAL_BASED_OUTPATIENT_CLINIC_OR_DEPARTMENT_OTHER)
Admission: RE | Admit: 2020-02-13 | Discharge: 2020-02-13 | Disposition: A | Discharge status: Home / Self Care | Payer: Commercial Managed Care - PPO | Attending: Orthopedic Surgery | Admitting: Orthopedic Surgery

## 2020-02-13 ENCOUNTER — Other Ambulatory Visit: Payer: Self-pay

## 2020-02-13 ENCOUNTER — Encounter (HOSPITAL_BASED_OUTPATIENT_CLINIC_OR_DEPARTMENT_OTHER): Payer: Self-pay | Admitting: Orthopedic Surgery

## 2020-02-13 DIAGNOSIS — M720 Palmar fascial fibromatosis [Dupuytren]: Secondary | ICD-10-CM | POA: Insufficient documentation

## 2020-02-13 DIAGNOSIS — J449 Chronic obstructive pulmonary disease, unspecified: Secondary | ICD-10-CM | POA: Diagnosis not present

## 2020-02-13 HISTORY — PX: DUPUYTREN CONTRACTURE RELEASE: SHX1478

## 2020-02-13 SURGERY — RELEASE, DUPUYTREN CONTRACTURE
Anesthesia: Regional | Site: Hand | Laterality: Right

## 2020-02-13 MED ORDER — BUPIVACAINE-EPINEPHRINE (PF) 0.5% -1:200000 IJ SOLN
INTRAMUSCULAR | Status: DC | PRN
  Administered 2020-02-13: 30 mL via PERINEURAL

## 2020-02-13 MED ORDER — TRAMADOL HCL 50 MG PO TABS: 50.0000 mg | ORAL_TABLET | Freq: Four times a day (QID) | ORAL | 0 refills | Status: DC | PRN

## 2020-02-13 MED ORDER — MIDAZOLAM HCL 2 MG/2ML IJ SOLN
INTRAMUSCULAR | Status: AC
  Filled 2020-02-13: qty 2

## 2020-02-13 MED ORDER — FENTANYL CITRATE (PF) 100 MCG/2ML IJ SOLN
100.0000 ug | Freq: Once | INTRAMUSCULAR | Status: AC
  Administered 2020-02-13 (×2): 50 ug via INTRAVENOUS

## 2020-02-13 MED ORDER — ONDANSETRON HCL 4 MG/2ML IJ SOLN
INTRAMUSCULAR | Status: AC
  Filled 2020-02-13: qty 2

## 2020-02-13 MED ORDER — LACTATED RINGERS IV SOLN: INTRAVENOUS | Status: DC

## 2020-02-13 MED ORDER — THROMBIN 5000 UNITS EX SOLR
CUTANEOUS | Status: AC
  Filled 2020-02-13: qty 5000

## 2020-02-13 MED ORDER — FENTANYL CITRATE (PF) 100 MCG/2ML IJ SOLN
INTRAMUSCULAR | Status: AC
  Filled 2020-02-13: qty 2

## 2020-02-13 MED ORDER — PROPOFOL 500 MG/50ML IV EMUL
INTRAVENOUS | Status: AC
  Filled 2020-02-13: qty 50

## 2020-02-13 MED ORDER — GLYCOPYRROLATE 0.2 MG/ML IJ SOLN
INTRAMUSCULAR | Status: DC | PRN
  Administered 2020-02-13: .2 mg via INTRAVENOUS

## 2020-02-13 MED ORDER — ONDANSETRON HCL 4 MG/2ML IJ SOLN
INTRAMUSCULAR | Status: DC | PRN
  Administered 2020-02-13: 4 mg via INTRAVENOUS

## 2020-02-13 MED ORDER — GLYCOPYRROLATE PF 0.2 MG/ML IJ SOSY
PREFILLED_SYRINGE | INTRAMUSCULAR | Status: AC
  Filled 2020-02-13: qty 1

## 2020-02-13 MED ORDER — THROMBIN 5000 UNITS EX SOLR
CUTANEOUS | Status: DC | PRN
  Administered 2020-02-13: 5000 [IU] via TOPICAL

## 2020-02-13 MED ORDER — CEFAZOLIN SODIUM-DEXTROSE 2-4 GM/100ML-% IV SOLN
INTRAVENOUS | Status: AC
  Filled 2020-02-13: qty 100

## 2020-02-13 MED ORDER — PROPOFOL 500 MG/50ML IV EMUL
INTRAVENOUS | Status: DC | PRN
  Administered 2020-02-13 (×2): 150 ug/kg/min via INTRAVENOUS
  Administered 2020-02-13: 100 ug/kg/min via INTRAVENOUS

## 2020-02-13 MED ORDER — MIDAZOLAM HCL 2 MG/2ML IJ SOLN
2.0000 mg | Freq: Once | INTRAMUSCULAR | Status: AC
  Administered 2020-02-13: 2 mg via INTRAVENOUS

## 2020-02-13 MED ORDER — PROPOFOL 10 MG/ML IV BOLUS
INTRAVENOUS | Status: DC | PRN
  Administered 2020-02-13: 10 mg via INTRAVENOUS
  Administered 2020-02-13: 20 mg via INTRAVENOUS
  Administered 2020-02-13 (×4): 10 mg via INTRAVENOUS

## 2020-02-13 MED ORDER — FENTANYL CITRATE (PF) 100 MCG/2ML IJ SOLN
INTRAMUSCULAR | Status: DC | PRN
  Administered 2020-02-13: 50 ug via INTRAVENOUS

## 2020-02-13 MED ORDER — CEFAZOLIN SODIUM-DEXTROSE 2-4 GM/100ML-% IV SOLN
2.0000 g | INTRAVENOUS | Status: AC
  Administered 2020-02-13: 2 g via INTRAVENOUS

## 2020-02-13 MED ORDER — LIDOCAINE 2% (20 MG/ML) 5 ML SYRINGE
INTRAMUSCULAR | Status: AC
  Filled 2020-02-13: qty 5

## 2020-02-13 MED ORDER — LIDOCAINE HCL (CARDIAC) PF 100 MG/5ML IV SOSY
PREFILLED_SYRINGE | INTRAVENOUS | Status: DC | PRN
  Administered 2020-02-13: 40 mg via INTRAVENOUS

## 2020-02-13 MED ORDER — DEXAMETHASONE SODIUM PHOSPHATE 10 MG/ML IJ SOLN
INTRAMUSCULAR | Status: AC
  Filled 2020-02-13: qty 1

## 2020-02-13 SURGICAL SUPPLY — 54 items
APL PRP STRL LF DISP 70% ISPRP (MISCELLANEOUS) ×1
BLADE MINI RND TIP GREEN BEAV (BLADE) ×1 IMPLANT
BLADE SURG 15 STRL LF DISP TIS (BLADE) ×1 IMPLANT
BLADE SURG 15 STRL SS (BLADE) ×2
BNDG CMPR 9X4 STRL LF SNTH (GAUZE/BANDAGES/DRESSINGS) ×1
BNDG COHESIVE 3X5 TAN STRL LF (GAUZE/BANDAGES/DRESSINGS) ×2 IMPLANT
BNDG ESMARK 4X9 LF (GAUZE/BANDAGES/DRESSINGS) ×2 IMPLANT
BNDG GAUZE ELAST 4 BULKY (GAUZE/BANDAGES/DRESSINGS) ×2 IMPLANT
CHLORAPREP W/TINT 26 (MISCELLANEOUS) ×2 IMPLANT
CORD BIPOLAR FORCEPS 12FT (ELECTRODE) ×2 IMPLANT
COVER BACK TABLE 60X90IN (DRAPES) ×2 IMPLANT
COVER MAYO STAND STRL (DRAPES) ×2 IMPLANT
COVER WAND RF STERILE (DRAPES) IMPLANT
CUFF TOURN SGL QUICK 18X4 (TOURNIQUET CUFF) IMPLANT
DECANTER SPIKE VIAL GLASS SM (MISCELLANEOUS) IMPLANT
DRAPE EXTREMITY T 121X128X90 (DISPOSABLE) ×2 IMPLANT
DRAPE SURG 17X23 STRL (DRAPES) ×2 IMPLANT
GAUZE SPONGE 4X4 12PLY STRL (GAUZE/BANDAGES/DRESSINGS) ×2 IMPLANT
GAUZE XEROFORM 1X8 LF (GAUZE/BANDAGES/DRESSINGS) ×2 IMPLANT
GLOVE BIO SURGEON STRL SZ7.5 (GLOVE) ×1 IMPLANT
GLOVE BIO SURGEON STRL SZ8 (GLOVE) ×2 IMPLANT
GLOVE BIOGEL PI IND STRL 7.5 (GLOVE) IMPLANT
GLOVE BIOGEL PI IND STRL 8.5 (GLOVE) ×1 IMPLANT
GLOVE BIOGEL PI INDICATOR 7.5 (GLOVE) ×1
GLOVE BIOGEL PI INDICATOR 8.5 (GLOVE) ×1
GLOVE SURG ORTHO 8.0 STRL STRW (GLOVE) ×2 IMPLANT
GLOVE SURG SYN 7.5  E (GLOVE) ×2
GLOVE SURG SYN 7.5 E (GLOVE) ×1 IMPLANT
GLOVE SURG SYN 7.5 PF PI (GLOVE) IMPLANT
GOWN STRL REUS W/ TWL LRG LVL3 (GOWN DISPOSABLE) ×1 IMPLANT
GOWN STRL REUS W/ TWL XL LVL3 (GOWN DISPOSABLE) IMPLANT
GOWN STRL REUS W/TWL 2XL LVL3 (GOWN DISPOSABLE) ×1 IMPLANT
GOWN STRL REUS W/TWL LRG LVL3 (GOWN DISPOSABLE)
GOWN STRL REUS W/TWL XL LVL3 (GOWN DISPOSABLE) ×5 IMPLANT
LOOP VESSEL MAXI BLUE (MISCELLANEOUS) ×2 IMPLANT
NS IRRIG 1000ML POUR BTL (IV SOLUTION) ×2 IMPLANT
PACK BASIN DAY SURGERY FS (CUSTOM PROCEDURE TRAY) ×2 IMPLANT
PAD CAST 3X4 CTTN HI CHSV (CAST SUPPLIES) ×1 IMPLANT
PADDING CAST ABS 3INX4YD NS (CAST SUPPLIES)
PADDING CAST ABS 4INX4YD NS (CAST SUPPLIES) ×1
PADDING CAST ABS COTTON 3X4 (CAST SUPPLIES) IMPLANT
PADDING CAST ABS COTTON 4X4 ST (CAST SUPPLIES) ×1 IMPLANT
PADDING CAST COTTON 3X4 STRL (CAST SUPPLIES) ×2
SLEEVE SCD COMPRESS KNEE MED (MISCELLANEOUS) ×2 IMPLANT
SLING ARM FOAM STRAP LRG (SOFTGOODS) ×1 IMPLANT
SPLINT PLASTER CAST XFAST 3X15 (CAST SUPPLIES) IMPLANT
SPLINT PLASTER XTRA FASTSET 3X (CAST SUPPLIES) ×10
STOCKINETTE 4X48 STRL (DRAPES) ×2 IMPLANT
SUT ETHILON 4 0 PS 2 18 (SUTURE) ×3 IMPLANT
SUT SILK 2 0 PERMA HAND 18 BK (SUTURE) IMPLANT
SYR BULB EAR ULCER 3OZ GRN STR (SYRINGE) ×2 IMPLANT
SYR CONTROL 10ML LL (SYRINGE) ×1 IMPLANT
TOWEL GREEN STERILE FF (TOWEL DISPOSABLE) ×4 IMPLANT
UNDERPAD 30X36 HEAVY ABSORB (UNDERPADS AND DIAPERS) ×2 IMPLANT

## 2020-02-13 NOTE — Discharge Instructions (Addendum)
Post Anesthesia Home Care Instructions  Activity: Get plenty of rest for the remainder of the day. A responsible individual must stay with you for 24 hours following the procedure.  For the next 24 hours, DO NOT: -Drive a car -Operate machinery -Drink alcoholic beverages -Take any medication unless instructed by your physician -Make any legal decisions or sign important papers.  Meals: Start with liquid foods such as gelatin or soup. Progress to regular foods as tolerated. Avoid greasy, spicy, heavy foods. If nausea and/or vomiting occur, drink only clear liquids until the nausea and/or vomiting subsides. Call your physician if vomiting continues.  Special Instructions/Symptoms: Your throat may feel dry or sore from the anesthesia or the breathing tube placed in your throat during surgery. If this causes discomfort, gargle with warm salt water. The discomfort should disappear within 24 hours.  If you had a scopolamine patch placed behind your ear for the management of post- operative nausea and/or vomiting:  1. The medication in the patch is effective for 72 hours, after which it should be removed.  Wrap patch in a tissue and discard in the trash. Wash hands thoroughly with soap and water. 2. You may remove the patch earlier than 72 hours if you experience unpleasant side effects which may include dry mouth, dizziness or visual disturbances. 3. Avoid touching the patch. Wash your hands with soap and water after contact with the patch.      Regional Anesthesia Blocks  1. Numbness or the inability to move the "blocked" extremity may last from 3-48 hours after placement. The length of time depends on the medication injected and your individual response to the medication. If the numbness is not going away after 48 hours, call your surgeon.  2. The extremity that is blocked will need to be protected until the numbness is gone and the  Strength has returned. Because you cannot feel it, you  will need to take extra care to avoid injury. Because it may be weak, you may have difficulty moving it or using it. You may not know what position it is in without looking at it while the block is in effect.  3. For blocks in the legs and feet, returning to weight bearing and walking needs to be done carefully. You will need to wait until the numbness is entirely gone and the strength has returned. You should be able to move your leg and foot normally before you try and bear weight or walk. You will need someone to be with you when you first try to ensure you do not fall and possibly risk injury.  4. Bruising and tenderness at the needle site are common side effects and will resolve in a few days.  5. Persistent numbness or new problems with movement should be communicated to the surgeon or the [facility] ([phone])/ [facility] ([phone]).    Hand Center Instructions Hand Surgery  Wound Care: Keep your hand elevated above the level of your heart.  Do not allow it to dangle by your side.  Keep the dressing dry and do not remove it unless your doctor advises you to do so.  He will usually change it at the time of your post-op visit.  Moving your fingers is advised to stimulate circulation but will depend on the site of your surgery.  If you have a splint applied, your doctor will advise you regarding movement.  Activity: Do not drive or operate machinery today.  Rest today and   then you may return to your normal activity and work as indicated by your physician.  Diet:  Drink liquids today or eat a light diet.  You may resume a regular diet tomorrow.    General expectations: Pain for two to three days. Fingers may become slightly swollen.  Call your doctor if any of the following occur: Severe pain not relieved by pain medication. Elevated temperature. Dressing soaked with blood. Inability to move fingers. White or bluish color to fingers.  

## 2020-02-13 NOTE — Op Note (Signed)
NAME: Timothy CAMBRIDGE Sr. MEDICAL RECORD NO: [id number] DATE OF BIRTH: 28-Nov-1960 FACILITY: [facility] LOCATION:  SURGERY CENTER PHYSICIAN: Nicki Reaper, MD   OPERATIVE REPORT   DATE OF PROCEDURE: 02/13/20    PREOPERATIVE DIAGNOSIS:   Dupuytren joints contracture of right ring finger   POSTOPERATIVE DIAGNOSIS:   Same   PROCEDURE:   Fasciectomy right ring finger palm and finger with release PIP joint   SURGEON: Cindee Salt, M.D.   ASSISTANT: Betha Loa, MD   ANESTHESIA:  Regional with sedation   INTRAVENOUS FLUIDS:  Per anesthesia flow sheet.   ESTIMATED BLOOD LOSS:  Minimal.   COMPLICATIONS:  None.   SPECIMENS:   Palmar fascia   TOURNIQUET TIME:    Total Tourniquet Time Documented: Upper Arm (Right) - 76 minutes Total: Upper Arm (Right) - 76 minutes    DISPOSITION:  Stable to PACU.   INDICATIONS: Patient is a 60 year old male with a significant flexion deformity to his right ring finger MP PIP joint.  There is approximately 50 degrees at each.  It is relatively fixed.  He is desirous of having this removed being well aware that there is no guarantee with the surgery the possibility of infection recurrence injury to arteries nerves tendons incomplete relief symptoms dystrophy the possibility of loss of finger possibility of recurrence of the flexion deformity with only 50% correction generally of the PIP joint with a possibility of recurrence of the entire cord in the future.  Preoperative area the patient is seen extremity marked by both patient and surgeon supraclavicular block was given by the anesthesia department without difficulty  OPERATIVE COURSE: Patient brought to the operating room where he was placed in a supine position with the right arm free.  He was prepped using ChloraPrep a 3-minute dry time was allowed and timeout taken to confirm patient procedure.  The limb was exsanguinated with an Esmarch bandage turn placed on the arm inflated to 250 mmHg.  A  volar Bruner incision was made beginning at the proximal aspect of the palmar fascia where it generally attaches to the flexor retinaculum.  This was carried distally on the ring finger.  This was done in stages to allow visualization of the neurovascular bundles proximally the proximal aspect of the palmar fascia was detached from the distal portion of the flexor retinaculum after identification of the neurovascular bundles beneath both the ring and small and middle fingers.  This allowed some extension of the metacarpal phalangeal joint with care the dissection was carried distally visualizing the neurovascular bundles over the entire course.  The incision was extended as necessary as the finger gradually came into further extension spiral cords were not noted a significant central cord lateral digital sheet cord and an natatory cord to the middle finger reach identified.  These were gradually excised as the neurovascular bundles were traced distally.  On completion of the removal of the cord the PIP joint did not come straight a transverse incision was then made over the flexor sheath proximal to the PIP joint this allowed the finger to be further extended to approximately a 10 degree loss of full extension.  Wound was copiously irrigated with saline  \and thrombin.  A double vessel loop drain was placed in the depths of the wound.  The V's were converted to Y'sand the sutures closed with interrupted 4-0 nylon sutures.  A sterile compressive dressing was applied the tourniquet deflated the ring finger pinked a dorsal splint was applied the patient was  taken to the recovery room for observation in satisfactory condition.  He will be discharged home to return to the hand center [facility] in 1week Tylenol ibuprofen with Ultram for breakthrough.   Cindee Salt, MD Electronically signed, 02/13/20

## 2020-02-13 NOTE — Op Note (Signed)
I assisted Surgeon(s) and Role:    Cindee Salt, MD - Primary on the Procedure(s): DUPUYTREN CONTRACTURE RELEASE RIGHT PALM AND RIGHT RING FINGER AND POSSIBLE MIDDLE FINGER on 02/13/2020.  I provided assistance on this case as follows: retraction soft tissues, identification of structures, dissection, closure wound.  Electronically signed by: Betha Loa, MD Date: 02/13/2020 Time: 2:42 PM

## 2020-02-13 NOTE — Anesthesia Procedure Notes (Signed)
Anesthesia Regional Block: Supraclavicular block   Pre-Anesthetic Checklist: ,, timeout performed, Correct Patient, Correct Site, Correct Laterality, Correct Procedure, Correct Position, site marked, Risks and benefits discussed,  Surgical consent,  Pre-op evaluation,  At surgeon's request and post-op pain management  Laterality: Right  Prep: chloraprep       Needles:  Injection technique: Single-shot  Needle Type: Echogenic Stimulator Needle     Needle Length: 9cm  Needle Gauge: 21     Additional Needles:   Procedures:,,,, ultrasound used (permanent image in chart),,,,  Narrative:  Start time: 02/13/2020 12:37 PM End time: 02/13/2020 12:32 PM Injection made incrementally with aspirations every 5 mL.  Performed by: Personally  Anesthesiologist: Shelton Silvas, MD  Additional Notes: Patient tolerated the procedure well. Local anesthetic introduced in an incremental fashion under minimal resistance after negative aspirations. No paresthesias were elicited. After completion of the procedure, no acute issues were identified and patient continued to be monitored by RN.

## 2020-02-13 NOTE — H&P (Signed)
Timothy Bogus Sr. is an 60 y.o. male.   Chief Complaint: dupuytrens contracture right ring finger extension to middle HPI: Timothy Terry is a 60 year old right-hand-dominant male referred by Dr. Dorris Fetch for consultation regarding a trigger finger to his right ring finger. He states is been going on for 10 years. Recalls no history of injury. He does not know his ancestry. He states the has siblings none of which have problems with her hands parents did not have problems with his hand he has no lumps on his feet no curvature of his penis. Is not complain of any pain or discomfort. His symptoms gotten progressively worse. He has no history of diabetes thyroid problems arthritis or gout. Family history is positive arthritis negative for the remainder. He does not drink and he does smoke.    Past Medical History:  Diagnosis Date  . COPD (chronic obstructive pulmonary disease) (HCC)   . Dupuytren's contracture of right hand   . Hypertension   . Smoker     Past Surgical History:  Procedure Laterality Date  . SPLENECTOMY, TOTAL     at 60 years of age s/p fall from building    History reviewed. No pertinent family history. Social History:  reports that he has been smoking cigarettes. He has been smoking about 1.00 pack per day. He has never used smokeless tobacco. He reports current alcohol use of about 12.0 standard drinks of alcohol per week. He reports current drug use. Frequency: 1.00 time per week. Drug: Marijuana.  Allergies:  Allergies  Allergen Reactions  . Bee Venom     No medications prior to admission.    No results found for this or any previous visit (from the past 48 hour(s)).  No results found.   Pertinent items are noted in HPI.  Height 6\' 3"  (1.905 m), weight 102.1 kg.  General appearance: alert, cooperative and appears stated age Head: Normocephalic, without obvious abnormality Neck: no carotid bruit and no JVD Resp: clear to auscultation bilaterally Cardio: regular  rate and rhythm, S1, S2 normal, no murmur, click, rub or gallop GI: soft, non-tender; bowel sounds normal; no masses,  no organomegaly Extremities: contracture right ring finger Pulses: 2+ and symmetric Skin: Skin color, texture, turgor normal. No rashes or lesions Neurologic: Grossly normal Incision/Wound: na  Assessment/Plan Assessment:  1. 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 This would be primarily to his ring finger. The if his middle finger come straight to without incisions on the middle finger we will attempt that. He is aware that there is no guarantee to the surgery possibility of infection recurrence injury to arteries nerves tendons incomplete relief symptoms dystrophy possibility of finger loss. This is scheduled as an outpatient under regional anesthesia for fasciectomy right ring finger possible middle.     02/13/2020, 5:57 AM

## 2020-02-13 NOTE — Anesthesia Preprocedure Evaluation (Addendum)
Anesthesia Evaluation  Patient identified by MRN, date of birth, ID band Patient awake    Reviewed: Allergy & Precautions, NPO status , Patient's Chart, lab work & pertinent test results  Airway Mallampati: II  TM Distance: >3 FB Neck ROM: Full    Dental  (+) Teeth Intact, Dental Advisory Given   Pulmonary COPD,  COPD inhaler, Current Smoker,    breath sounds clear to auscultation       Cardiovascular hypertension, Pt. on medications and Pt. on home beta blockers  Rhythm:Regular Rate:Normal     Neuro/Psych negative neurological ROS  negative psych ROS   GI/Hepatic negative GI ROS, Neg liver ROS,   Endo/Other  negative endocrine ROS  Renal/GU negative Renal ROS     Musculoskeletal negative musculoskeletal ROS (+)   Abdominal Normal abdominal exam  (+)   Peds  Hematology negative hematology ROS (+)   Anesthesia Other Findings   Reproductive/Obstetrics                            Anesthesia Physical Anesthesia Plan  ASA: III  Anesthesia Plan: Regional   Post-op Pain Management:    Induction: Intravenous  PONV Risk Score and Plan: 2 and Ondansetron, Dexamethasone, Midazolam and Propofol infusion  Airway Management Planned: Natural Airway and Simple Face Mask  Additional Equipment: None  Intra-op Plan:   Post-operative Plan:   Informed Consent: I have reviewed the patients History and Physical, chart, labs and discussed the procedure including the risks, benefits and alternatives for the proposed anesthesia with the patient or authorized representative who has indicated his/her understanding and acceptance.       Plan Discussed with: CRNA  Anesthesia Plan Comments:       Anesthesia Quick Evaluation

## 2020-02-13 NOTE — Anesthesia Postprocedure Evaluation (Signed)
Anesthesia Post Note  Patient: Timothy AROCHO Sr.  Procedure(s) Performed: DUPUYTREN CONTRACTURE RELEASE RIGHT PALM AND RIGHT RING FINGER AND POSSIBLE MIDDLE FINGER (Right Hand)     Patient location during evaluation: PACU Anesthesia Type: Regional and MAC Level of consciousness: awake Pain management: pain level controlled Respiratory status: spontaneous breathing Cardiovascular status: stable Postop Assessment: no apparent nausea or vomiting Anesthetic complications: no   No complications documented.  Last Vitals:  Vitals:   02/13/20 1515 02/13/20 1535  BP: 130/85 (!) 148/89  Pulse: 71 67  Resp: 14 16  Temp: (!) 35.9 C (!) 36.3 C  SpO2: 96% 97%    Last Pain:  Vitals:   02/13/20 1535  TempSrc:   PainSc: 0-No pain                 Darric Plante

## 2020-02-13 NOTE — Transfer of Care (Signed)
Immediate Anesthesia Transfer of Care Note  Patient: Timothy KEETCH Sr.  Procedure(s) Performed: DUPUYTREN CONTRACTURE RELEASE RIGHT PALM AND RIGHT RING FINGER AND POSSIBLE MIDDLE FINGER (Right Hand)  Patient Location: PACU  Anesthesia Type:MAC combined with regional for post-op pain  Level of Consciousness: awake, alert  and oriented  Airway & Oxygen Therapy: Patient Spontanous Breathing and Patient connected to face mask oxygen  Post-op Assessment: Report given to RN and Post -op Vital signs reviewed and stable  Post vital signs: Reviewed and stable  Last Vitals:  Vitals Value Taken Time  BP    Temp    Pulse 74 02/13/20 1445  Resp 17 02/13/20 1445  SpO2 100 % 02/13/20 1445  Vitals shown include unvalidated device data.  Last Pain:  Vitals:   02/13/20 1203  TempSrc: Oral  PainSc: 0-No pain         Complications: No complications documented.

## 2020-02-13 NOTE — Brief Op Note (Signed)
02/13/2020  2:44 PM  PATIENT:  Timothy Bogus Sr.  60 y.o. male  PRE-OPERATIVE DIAGNOSIS:  DUPUYTRENS CONTRACTURE RIGHT PALM AND RIGHT RING FINGER, POSSIBLE MIDDLE FINGER  POST-OPERATIVE DIAGNOSIS:  DUPUYTRENS CONTRACTURE RIGHT PALM AND RIGHT RING FINGER, POSSIBLE MIDDLE FINGER  PROCEDURE:  Procedure(s) with comments: DUPUYTREN CONTRACTURE RELEASE RIGHT PALM AND RIGHT RING FINGER AND POSSIBLE MIDDLE FINGER (Right) - AXILLARY BLOCK  SURGEON:  Surgeon(s) and Role:    * Cindee Salt, MD - Primary  PHYSICIAN ASSISTANT:   ASSISTANTS: K Devlin Mcveigh,MD   ANESTHESIA:   regional and IV sedation  EBL:  5 mL   BLOOD ADMINISTERED:none  DRAINS: Penrose drain in the finger   LOCAL MEDICATIONS USED:  NONE  SPECIMEN:  Excision  DISPOSITION OF SPECIMEN:  PATHOLOGY  COUNTS:  YES  TOURNIQUET:   Total Tourniquet Time Documented: Upper Arm (Right) - 76 minutes Total: Upper Arm (Right) - 76 minutes   DICTATION: .Reubin Milan Dictation  PLAN OF CARE: Discharge to home after PACU  PATIENT DISPOSITION:  PACU - hemodynamically stable.

## 2020-02-13 NOTE — Progress Notes (Signed)
Assisted Dr. Hollis with right, ultrasound guided, supraclavicular block. Side rails up, monitors on throughout procedure. See vital signs in flow sheet. Tolerated Procedure well. 

## 2020-02-16 ENCOUNTER — Encounter (HOSPITAL_BASED_OUTPATIENT_CLINIC_OR_DEPARTMENT_OTHER): Payer: Self-pay | Admitting: Orthopedic Surgery

## 2020-02-16 LAB — SURGICAL PATHOLOGY

## 2020-09-20 ENCOUNTER — Other Ambulatory Visit: Payer: Self-pay | Admitting: *Deleted

## 2020-09-20 DIAGNOSIS — F17210 Nicotine dependence, cigarettes, uncomplicated: Secondary | ICD-10-CM

## 2020-10-01 ENCOUNTER — Encounter: Payer: Self-pay | Admitting: Neurology

## 2020-10-29 ENCOUNTER — Other Ambulatory Visit: Payer: Self-pay

## 2020-10-29 ENCOUNTER — Ambulatory Visit (INDEPENDENT_AMBULATORY_CARE_PROVIDER_SITE_OTHER)
Admission: RE | Admit: 2020-10-29 | Discharge: 2020-10-29 | Disposition: A | Discharge status: Home / Self Care | Payer: Commercial Managed Care - PPO | Source: Ambulatory Visit | Attending: Physician Assistant | Admitting: Physician Assistant

## 2020-10-29 DIAGNOSIS — F17210 Nicotine dependence, cigarettes, uncomplicated: Secondary | ICD-10-CM | POA: Diagnosis not present

## 2020-11-18 ENCOUNTER — Other Ambulatory Visit: Payer: Self-pay | Admitting: Acute Care

## 2020-11-18 ENCOUNTER — Encounter: Payer: Self-pay | Admitting: *Deleted

## 2020-11-18 DIAGNOSIS — F17210 Nicotine dependence, cigarettes, uncomplicated: Secondary | ICD-10-CM

## 2020-12-10 ENCOUNTER — Other Ambulatory Visit: Payer: Self-pay

## 2020-12-10 ENCOUNTER — Encounter: Payer: Self-pay | Admitting: Neurology

## 2020-12-10 ENCOUNTER — Other Ambulatory Visit (INDEPENDENT_AMBULATORY_CARE_PROVIDER_SITE_OTHER): Payer: Commercial Managed Care - PPO

## 2020-12-10 ENCOUNTER — Ambulatory Visit: Payer: Commercial Managed Care - PPO | Admitting: Neurology

## 2020-12-10 VITALS — BP 155/86 | HR 66 | Ht 75.0 in | Wt 217.0 lb

## 2020-12-10 DIAGNOSIS — E639 Nutritional deficiency, unspecified: Secondary | ICD-10-CM

## 2020-12-10 DIAGNOSIS — G621 Alcoholic polyneuropathy: Secondary | ICD-10-CM

## 2020-12-10 LAB — B12 AND FOLATE PANEL
Folate: 4.9 ng/mL — ABNORMAL LOW (ref >5.9)
Vitamin B-12: 143 pg/mL — ABNORMAL LOW (ref 211–911)

## 2020-12-10 LAB — TSH: TSH: 2.37 u[IU]/mL (ref 0.35–5.50)

## 2020-12-10 NOTE — Patient Instructions (Signed)
Start Gabapentin 300mg  at bedtime x 1 week, then increase to 300mg  twice daily  Check labs  Counseled on cutting back on alcohol consumption  Check your feet daily  Take caution on any uneven ground, you may want to consider using a cane/hiking stick  Return to clinic in 4 months

## 2020-12-10 NOTE — Progress Notes (Signed)
[facility] Clinic Note - Initial Visit   Date: 12/10/20  Timothy Bogus Sr. MRN: [id number] DOB: [birth year]   Dear Timothy Height, PA:  Thank you for your kind referral of Timothy MCQUEEN Sr. for consultation of neuropathy. Although his history is well known to you, please allow Timothy Terry to reiterate it for the purpose of our medical record. The patient was accompanied to the clinic by self.    History of Present Illness: Timothy JENSEN Sr. is a 60 y.o. right-handed male with COPD, tobacco use (1PPD x 45 + years), hypertension, and hyperlipidemia presenting for evaluation of tingling/numbness of the feet. For the past 2-3 years, he has constant numbness/tingling involving the soles of the feet.  No associated pain.  He takes gabapentin 100mg  three times daily which may have helped some.  He has some back pain with prolonged standing.  No weakness in the legs.  He has some imbalance, no falls, and walks unassisted. NCS/EMG performed by Dr. 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  He works in September 2022 in [employer] in [employer], [employer].  He drinks 3-6 beers daily for many years.  His labs indicated borderline-normal vitamin B12 and folate.  He is not on supplement or daily vitamin.    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   Labs 08/24/2020: vitamin B12 239, folate 3.5  Past Medical History:  Diagnosis Date   COPD (chronic obstructive pulmonary disease) (HCC)    Dupuytren's contracture of right hand    Hypertension    Smoker     Past Surgical History:  Procedure Laterality Date   DUPUYTREN CONTRACTURE RELEASE Right 02/13/2020   Procedure: DUPUYTREN CONTRACTURE RELEASE RIGHT PALM AND RIGHT  RING FINGER AND POSSIBLE MIDDLE FINGER;  Surgeon: 04/12/2020, MD;  Location: [facility];  Service: Orthopedics;  Laterality: Right;  AXILLARY BLOCK   SPLENECTOMY, TOTAL     at 60 years of age s/p fall from building     Medications:  Outpatient Encounter Medications as of 12/10/2020  Medication Sig   Aspirin-Salicylamide-Caffeine (BC HEADACHE PO) Take by mouth.   lisinopril-hydrochlorothiazide (ZESTORETIC) 20-12.5 MG tablet Take 1 tablet by mouth 2 (two) times daily.   metoprolol succinate (TOPROL-XL) 50 MG 24 hr tablet Take 50 mg by mouth daily.   rosuvastatin (CRESTOR) 10 MG tablet Take by mouth.   SYMBICORT 160-4.5 MCG/ACT inhaler Inhale 2 puffs into the lungs 2 (two) times daily.   traMADol (ULTRAM) 50 MG tablet Take 1 tablet (50 mg total) by mouth every 6 (six) hours as needed.   No facility-administered encounter medications on file as of 12/10/2020.    Allergies:  Allergies  Allergen Reactions   Bee Venom     Family History: Family History  Problem Relation Age of Onset   Breast cancer Mother    Hypertension Mother    Hypertension Father    Dementia Father    Parkinson's disease Father     Social History: Social History   Tobacco Use   Smoking status: Every Day    Packs/day: 1.00    Types: Cigarettes   Smokeless tobacco: Never  Vaping Use   Vaping Use: Never used  Substance Use Topics   Alcohol use: Yes    Alcohol/week: 12.0 standard drinks    Types:  12 Cans of beer per week    Comment: daily beer   Drug use: Yes    Frequency: 1.0 times per week    Types: Marijuana    Comment: smokes marijuana weekly   Social History   Social History Narrative   Right Handed    Lives in a one story home     Vital Signs:  BP (!) 155/86   Pulse 66   Ht 6\' 3"  (1.905 m)   Wt 217 lb (98.4 kg)   SpO2 96%   BMI 27.12 kg/m    Neurological Exam: MENTAL STATUS including orientation to time, place, person, recent and remote memory, attention span and  concentration, language, and fund of knowledge is normal.  Speech is not dysarthric.  CRANIAL NERVES: II:  No visual field defects.  III-IV-VI: Pupils equal round and reactive to light.  Normal conjugate, extra-ocular eye movements in all directions of gaze.  No nystagmus.  No ptosis.   V:  Normal facial sensation.    VII:  Normal facial symmetry and movements.   VIII:  Normal hearing and vestibular function.   IX-X:  Normal palatal movement.   XI:  Normal shoulder shrug and head rotation.   XII:  Normal tongue strength and range of motion, no deviation or fasciculation.  MOTOR:  R wrist with ganglion cyst. No atrophy, fasciculations or abnormal movements.  No pronator drift.   Upper Extremity:  Right  Left  Deltoid  5/5   5/5   Biceps  5/5   5/5   Triceps  5/5   5/5   Infraspinatus 5/5  5/5  Medial pectoralis 5/5  5/5  Wrist extensors  5/5   5/5   Wrist flexors  5/5   5/5   Finger extensors  5/5   5/5   Finger flexors  5/5   5/5   Dorsal interossei  5/5   5/5   Abductor pollicis  5/5   5/5   Tone (Ashworth scale)  0  0   Lower Extremity:  Right  Left  Hip flexors  5/5   5/5   Hip extensors  5/5   5/5   Adductor 5/5  5/5  Abductor 5/5  5/5  Knee flexors  5/5   5/5   Knee extensors  5/5   5/5   Dorsiflexors  5/5   5/5   Plantarflexors  5/5   5/5   Toe extensors  5/5   5/5   Toe flexors  5/5   5/5   Tone (Ashworth scale)  0  0   MSRs:  Right        Left                  brachioradialis 2+  2+  biceps 2+  2+  triceps 2+  2+  patellar 3+  3+  ankle jerk 2+  2+  Hoffman no  no  plantar response down  down   SENSORY:  Trace vibration at the toes bilaterally, reduced pin prick and temperature at the feet.  Vibration intact above the ankles.  Rhomberg sign is positive.  COORDINATION/GAIT: Normal finger-to- nose-finger.  Intact rapid alternating movements bilaterally.  Gait narrow based and stable. Stressed gait intact.  Unsteady with tandem gait.     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  Return to clinic in 4 months.    Thank you for allowing me to participate in patient's care.  If I can answer any additional questions, I would be pleased to do so.    Sincerely,    Korryn Pancoast K. , DO

## 2020-12-16 LAB — COPPER, SERUM: Copper: 88 ug/dL (ref 70–175)

## 2020-12-16 LAB — PROTEIN ELECTROPHORESIS, SERUM
Albumin ELP: 4.3 g/dL (ref 3.8–4.8)
Alpha 1: 0.3 g/dL (ref 0.2–0.3)
Alpha 2: 0.7 g/dL (ref 0.5–0.9)
Beta 2: 0.4 g/dL (ref 0.2–0.5)
Beta Globulin: 0.5 g/dL (ref 0.4–0.6)
Gamma Globulin: 1.2 g/dL (ref 0.8–1.7)
Total Protein: 7.3 g/dL (ref 6.1–8.1)

## 2020-12-16 LAB — VITAMIN B1: Vitamin B1 (Thiamine): <6 nmol/L — ABNORMAL LOW (ref 8–30)

## 2020-12-16 LAB — IMMUNOFIXATION ELECTROPHORESIS
IgG (Immunoglobin G), Serum: 1334 mg/dL (ref 600–1640)
IgM, Serum: 40 mg/dL — ABNORMAL LOW (ref 50–300)
Immunofix Electr Int: NOT DETECTED
Immunoglobulin A: 254 mg/dL (ref 47–310)

## 2020-12-17 ENCOUNTER — Telehealth: Payer: Self-pay | Admitting: Neurology

## 2020-12-17 NOTE — Telephone Encounter (Signed)
Pt said he is returning a call to someone 

## 2020-12-17 NOTE — Telephone Encounter (Signed)
Returned patients call. Please see Result Notes.

## 2021-04-14 NOTE — Progress Notes (Signed)
? ? ?Follow-up Visit ? ? ?Date: 04/15/21 ? ? ?Timothy Bogus Sr. ?MRN: [id number] ?DOB: November 30, 1960 ? ? ?Interim History: ?CLARKSON ROSSELLI Sr. is a 61 y.o. right-handed male with COPD, tobacco use (1PPD x 45 + years), hypertension, and hyperlipidemia returning to the clinic for follow-up of alcohol-induced neuropathy.  The patient was accompanied to the clinic by self. ? ?History of present illness: ?For the past 2-3 years, he has constant numbness/tingling involving the soles of the feet.  No associated pain.  He takes gabapentin 100mg  three times daily which may have helped some.  He has some back pain with prolonged standing.  No weakness in the legs.  He has some imbalance, no falls, and walks unassisted. NCS/EMG performed by Dr. 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 ? 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  He drinks 3-6 beers daily for many years.  His labs indicated borderline-normal vitamin B12 and folate.  He is not on supplement or daily vitamin.  ? ?UPDATE 04/15/2021:  He is here for follow-up visit.  He is abrasive today and frustrated that no one can fix his neuropathy and despite taking medication, such as gabapentin 300mg -300mg -200mg , he gets no relief. He has found the most benefit with wearing compression stockings and applying vicks vaporub to his feet.  He has no interest in reducing or abstaining from alcohol consumption.  He takes vitamin B12 and B1 supplement, but did not start folic acid.   ? ?Medications:  ?Current Outpatient Medications on File Prior to Visit  ?Medication Sig Dispense Refill  ? Aspirin-Salicylamide-Caffeine (BC HEADACHE PO) Take by mouth.    ? lisinopril-hydrochlorothiazide (ZESTORETIC) 20-12.5 MG tablet Take 1 tablet by mouth 2 (two) times daily.    ? metoprolol succinate (TOPROL-XL) 50 MG 24 hr tablet Take 50 mg by mouth daily.    ? rosuvastatin (CRESTOR) 10 MG tablet Take by  mouth.    ? SYMBICORT 160-4.5 MCG/ACT inhaler Inhale 2 puffs into the lungs 2 (two) times daily.    ? traMADol (ULTRAM) 50 MG tablet Take 1 tablet (50 mg total) by mouth every 6 (six) hours as needed. 20 tablet 0  ? ?No current facility-administered medications on file prior to visit.  ? ? ?Allergies:  ?Allergies  ?Allergen Reactions  ? Bee Venom   ? ? ?Vital Signs:  ?BP 134/88   Pulse 79   Resp 18   Ht 6' 3.5" (1.918 m)   Wt 219 lb (99.3 kg)   SpO2 96%   BMI 27.01 kg/m?  ? ? ?Neurological Exam: ?MENTAL STATUS including orientation to time, place, person, recent and remote memory, attention span and concentration, language, and fund of knowledge is normal.  Speech is not dysarthric. ? ?CRANIAL NERVES:   Normal conjugate, extra-ocular eye movements in all directions of gaze.  No ptosis  ? ?MOTOR:  Motor strength is 5/5 throughout. ? ?MSRs:  Reflexes are 2+/4 throughout. ? ?SENSORY:  Reduced vibration distally in the feet ? ?COORDINATION/GAIT:  Gait is mildly wide-based, stable, unassisted. ? 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 ?  ?Labs 08/24/2020: vitamin B12 239, folate 3.5 ?Labs 12/10/2020:  vitamin B12 143*, folate 4.9*, vitamin B1 < 6*, TSH 2.37, SPEP with IFE no M protein ? ? ?IMPRESSION/PLAN: ?Alcohol induced neuropathy.   ?Nutritional deficiency contributing to neuropathy - vitamin B12, thiamine, and folic acid ?Possible lumbar canal  stenosis (brisk reflexes) vs posterior column disorder from B12 deficiency ? ? ?PLAN/RECOMMENDATIONS:  ?Increase gabapentin to 600mg  twice daily ?Encouraged compliance with vitamin B12 , thiamine 100mg , and folic acid 1mg  daily ?Counseled on neurotoxic effects of alcohol and that his symptoms will progress overtime  ?Patient expresses mistrust in my care because I am unable to fix his neuropathy.  He is welcome to get a second opinion ? ?Return to clinic in 1 year ? ? ?Thank  you for allowing me to participate in patient's care.  If I can answer any additional questions, I would be pleased to do so.   ? ?Sincerely, ? ? ? ?Nautica Hotz K. , DO ? ? ?

## 2021-04-15 ENCOUNTER — Encounter: Payer: Self-pay | Admitting: Neurology

## 2021-04-15 ENCOUNTER — Other Ambulatory Visit: Payer: Self-pay

## 2021-04-15 ENCOUNTER — Ambulatory Visit: Payer: Commercial Managed Care - PPO | Admitting: Neurology

## 2021-04-15 VITALS — BP 134/88 | HR 79 | Resp 18 | Ht 75.5 in | Wt 219.0 lb

## 2021-04-15 DIAGNOSIS — E639 Nutritional deficiency, unspecified: Secondary | ICD-10-CM

## 2021-04-15 DIAGNOSIS — G621 Alcoholic polyneuropathy: Secondary | ICD-10-CM

## 2021-04-15 MED ORDER — GABAPENTIN 600 MG PO TABS: 600.0000 mg | ORAL_TABLET | Freq: Two times a day (BID) | ORAL | 3 refills | Status: DC

## 2021-04-15 NOTE — Patient Instructions (Addendum)
Start folic acid 1mg  daily, thiamine (vitamin B1 100mg ), and vitamin B12 daily ? ?Continue gabapentin 600mg  twice daily ? ?Return to clinic in 1 year ?

## 2021-10-25 ENCOUNTER — Encounter: Payer: Self-pay | Admitting: Neurology

## 2021-10-31 ENCOUNTER — Ambulatory Visit (HOSPITAL_COMMUNITY): Payer: Commercial Managed Care - PPO

## 2021-11-04 ENCOUNTER — Ambulatory Visit (HOSPITAL_COMMUNITY)
Admission: RE | Admit: 2021-11-04 | Discharge: 2021-11-04 | Disposition: A | Discharge status: Home / Self Care | Payer: Commercial Managed Care - PPO | Source: Ambulatory Visit | Attending: Acute Care | Admitting: Acute Care

## 2021-11-04 DIAGNOSIS — F17210 Nicotine dependence, cigarettes, uncomplicated: Secondary | ICD-10-CM | POA: Diagnosis not present

## 2021-11-09 ENCOUNTER — Telehealth: Payer: Self-pay | Admitting: Acute Care

## 2021-11-09 DIAGNOSIS — Z122 Encounter for screening for malignant neoplasm of respiratory organs: Secondary | ICD-10-CM

## 2021-11-09 DIAGNOSIS — F17210 Nicotine dependence, cigarettes, uncomplicated: Secondary | ICD-10-CM

## 2021-11-09 DIAGNOSIS — Z87891 Personal history of nicotine dependence: Secondary | ICD-10-CM

## 2021-11-09 NOTE — Telephone Encounter (Signed)
Timothy Terry this 1 is okay to send a lung RADS 2 letter.  The nodule is 8.2 mm but it has shrunk since the previous screening.  So he will be fine with a 32-month follow-up.  Thank you so much for your attention to detail it is greatly appreciated.

## 2021-11-09 NOTE — Telephone Encounter (Signed)
Results faxed to PCP and new order placed for annual LDCT 2024

## 2021-12-04 IMAGING — CT CT CHEST LUNG CANCER SCREENING LOW DOSE W/O CM
2 of 4 series · 15 of 36 positions shown, 18 images · non-contrast
Comparison: 09/26/2019

CLINICAL DATA: Lung cancer screening. Forty-six pack-year history.
Current asymptomatic smoker.

EXAM:
CT CHEST WITHOUT CONTRAST LOW-DOSE FOR LUNG CANCER SCREENING
TECHNIQUE: Multidetector CT imaging of the chest was performed following the
standard protocol without IV contrast.

[Series 3: lung thins 1.0 · axial · 0.81mm/px · z∈[+1294,+1580]mm · 12 of 316 slices shown, 15 images]
[im 15/316  mediastinal]
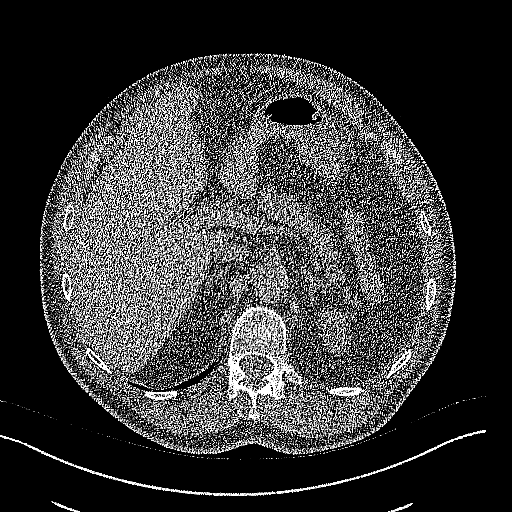
[im 15/316  lung]
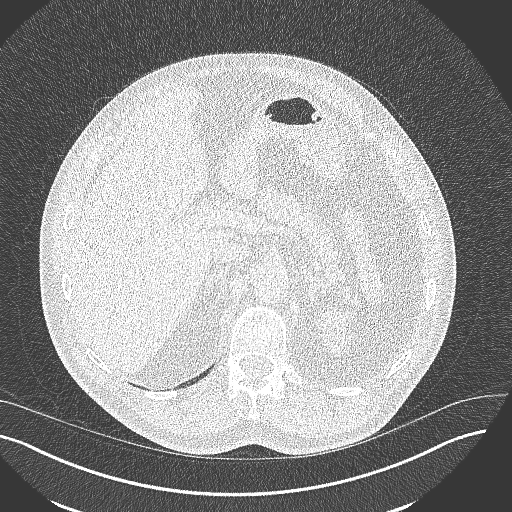
[im 43/316  lung]
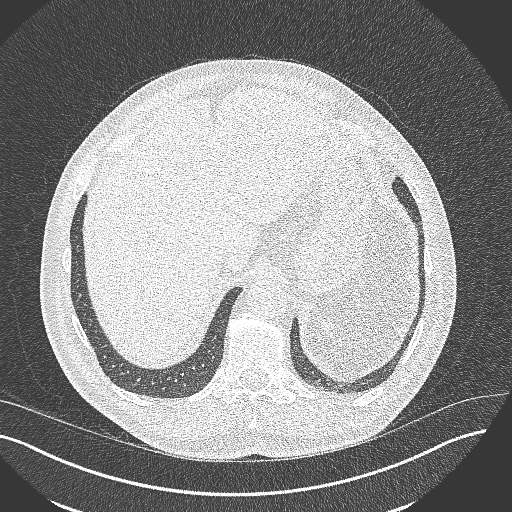
[im 72/316  lung]
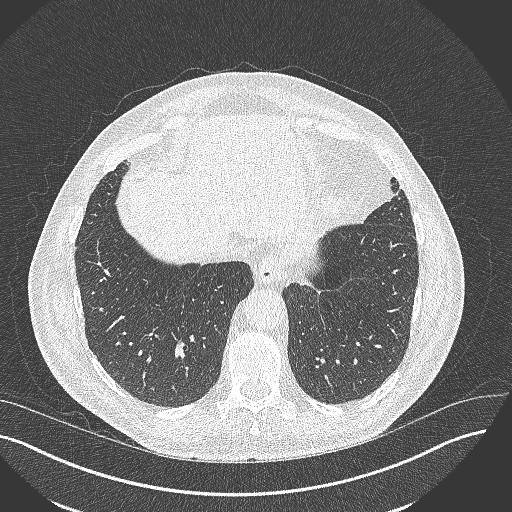
[im 101/316  lung]
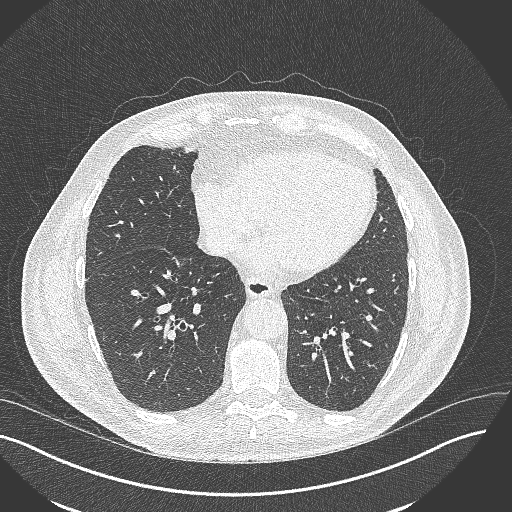
[im 115/316  mediastinal]
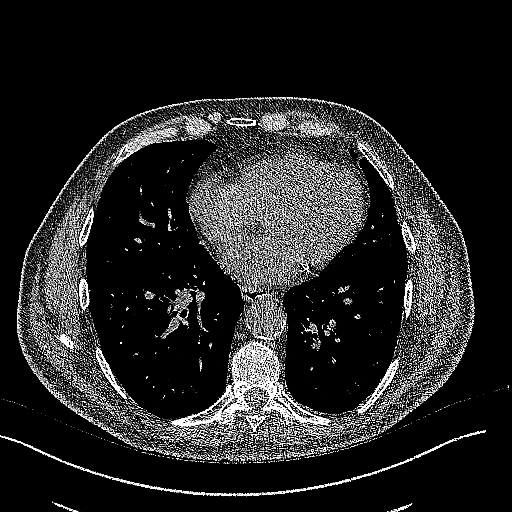
[im 115/316  lung]
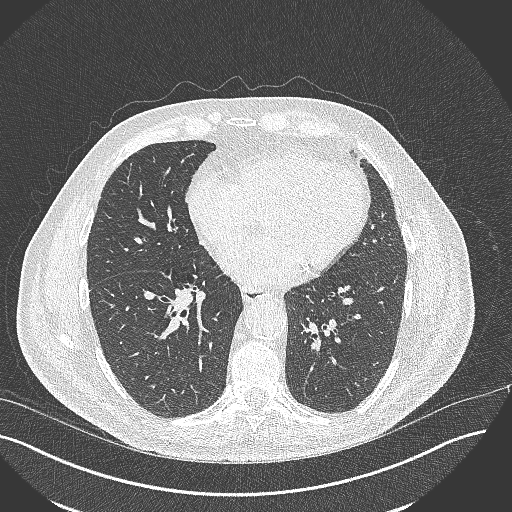
[im 144/316  lung]
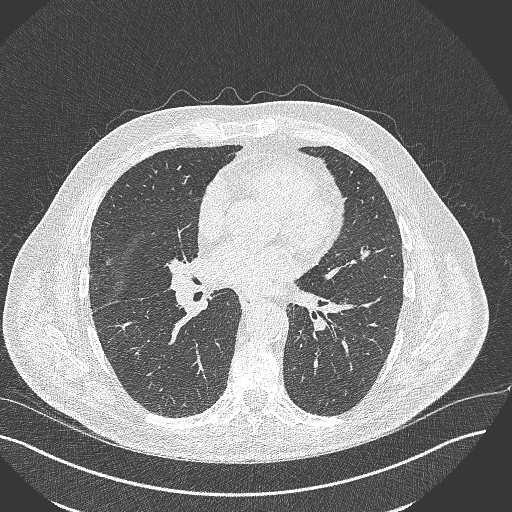
[im 172/316  lung]
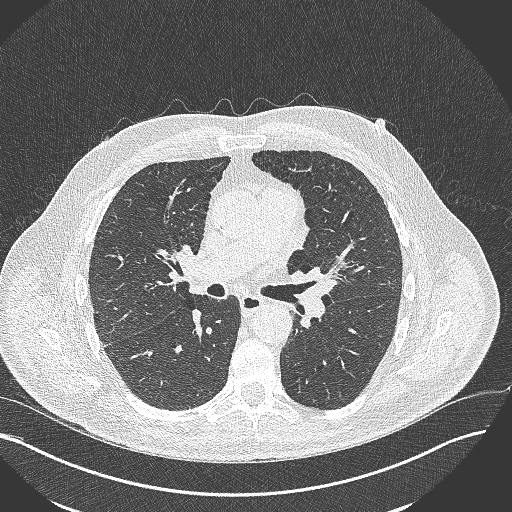
[im 201/316  lung]
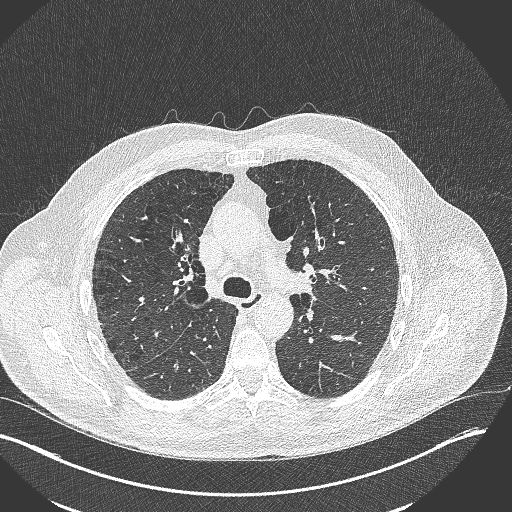
[im 215/316  mediastinal]
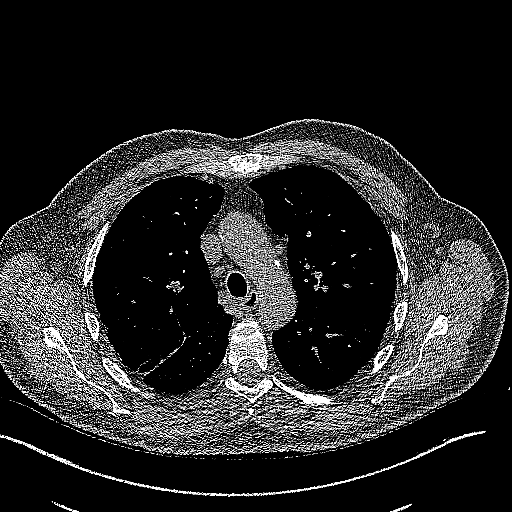
[im 215/316  lung]
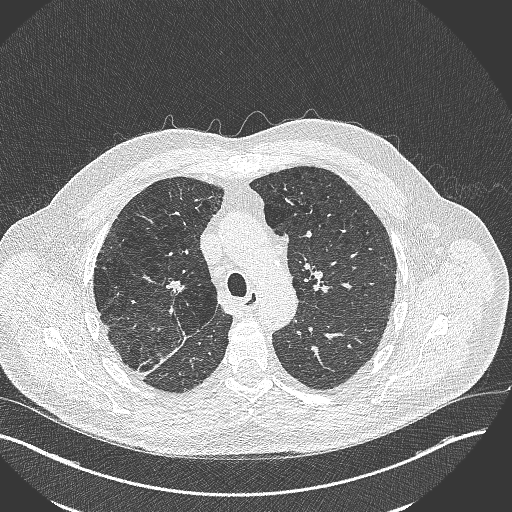
[im 244/316  lung]
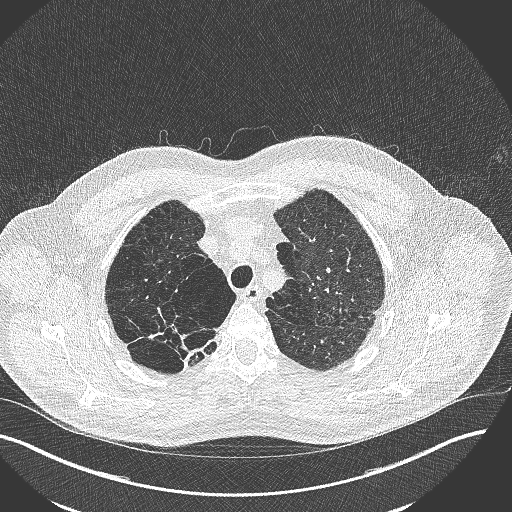
[im 273/316  lung]
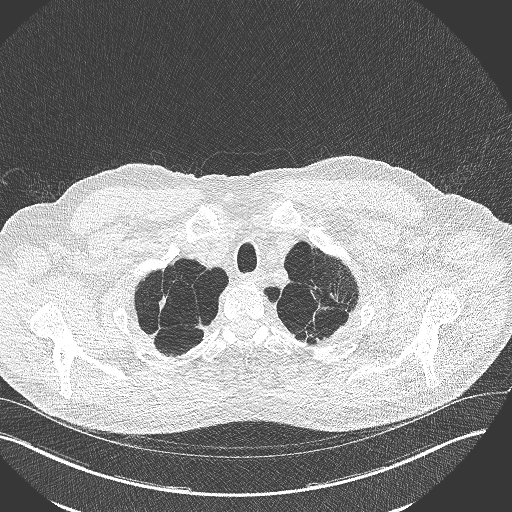
[im 301/316  lung]
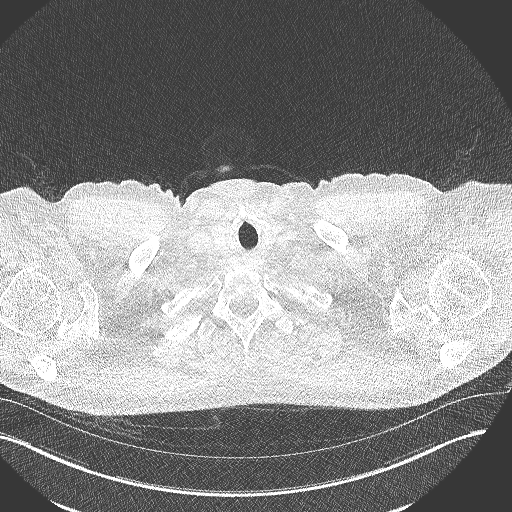

[Series 5: coronal · coronal · 0.61mm/px · 3 of 151 slices shown]
[im 31/151  lung]
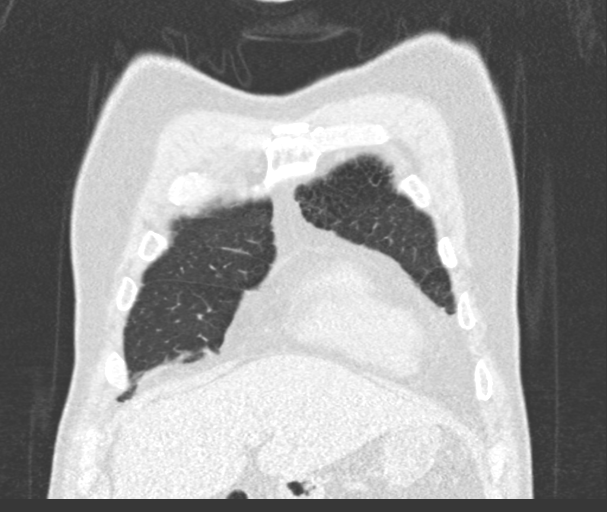
[im 61/151  lung]
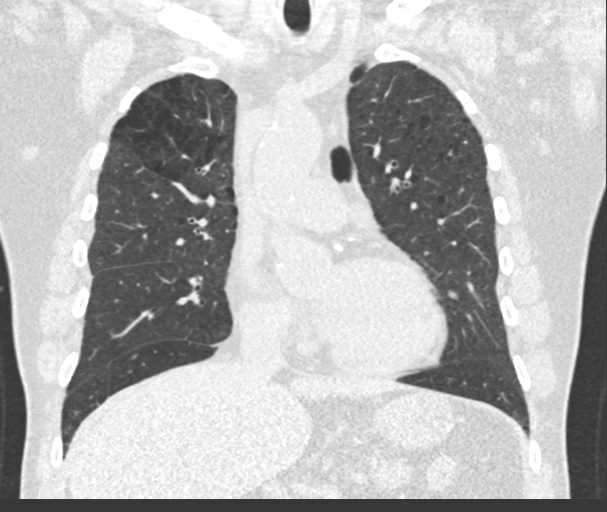
[im 91/151  lung]
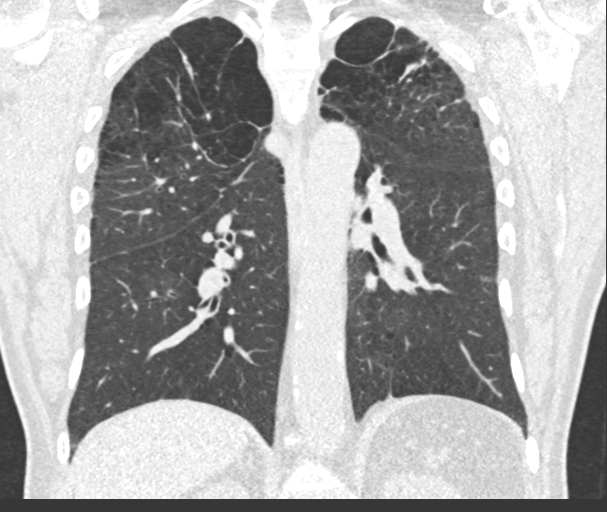

[15 of 36 positions shown; findings below may reference images not displayed]

FINDINGS: Cardiovascular: Normal heart size. Aortic atherosclerosis with
coronary artery calcifications.

Mediastinum/Nodes: No enlarged mediastinal, hilar, or axillary lymph
nodes. Thyroid gland, trachea, and esophagus demonstrate no
significant findings.

Lungs/Pleura: Severe changes of paraseptal and centrilobular
emphysema with diffuse bronchial wall thickening. Biapical
parenchymal scarring and volume loss with architectural distortion
noted. Previous nodule within the posterior left apex is unchanged
with a mean derived diameter of 11.6 mm. No new pulmonary nodules.

Upper Abdomen: No acute abnormality.

Musculoskeletal: No chest wall mass or suspicious bone lesions
identified.
IMPRESSION: 1. Lung-RADS 2, benign appearance or behavior. Continue annual
screening with low-dose chest CT without contrast in 12 months.
2. Diffuse bronchial wall thickening with emphysema, as above;
imaging findings suggestive of underlying COPD.
3. Coronary artery calcifications.
4. Aortic Atherosclerosis (YQ5Y9-O3A.A) and Emphysema (YQ5Y9-1W2.Z).

## 2022-04-10 ENCOUNTER — Ambulatory Visit: Payer: Commercial Managed Care - PPO | Admitting: Neurology

## 2022-04-14 ENCOUNTER — Ambulatory Visit: Payer: Commercial Managed Care - PPO | Admitting: Neurology

## 2022-04-18 ENCOUNTER — Ambulatory Visit: Payer: Commercial Managed Care - PPO | Admitting: Neurology

## 2022-04-21 ENCOUNTER — Ambulatory Visit: Payer: Commercial Managed Care - PPO | Admitting: Neurology

## 2022-04-28 ENCOUNTER — Other Ambulatory Visit: Payer: Self-pay | Admitting: Neurology

## 2022-04-28 NOTE — Telephone Encounter (Signed)
Patein is due to see Dr. Posey Pronto in May but has not been seen in a year after several cancelled appointments. Approval or denial for this medication

## 2022-06-28 ENCOUNTER — Ambulatory Visit: Payer: Commercial Managed Care - PPO | Admitting: Neurology

## 2022-06-28 ENCOUNTER — Encounter: Payer: Self-pay | Admitting: Neurology

## 2022-06-28 VITALS — BP 163/94 | HR 69 | Ht 75.5 in | Wt 220.0 lb

## 2022-06-28 DIAGNOSIS — R292 Abnormal reflex: Secondary | ICD-10-CM

## 2022-06-28 DIAGNOSIS — G621 Alcoholic polyneuropathy: Secondary | ICD-10-CM | POA: Diagnosis not present

## 2022-06-28 MED ORDER — GABAPENTIN 600 MG PO TABS: 600.0000 mg | ORAL_TABLET | Freq: Two times a day (BID) | ORAL | 3 refills | Status: DC

## 2022-06-28 NOTE — Progress Notes (Signed)
Follow-up Visit   Date: 06/28/22   Timothy Bogus Sr. MRN: [id number] DOB: 1960-11-16   Interim History: Timothy Bogus Sr. is a 62 y.o. right-handed male with COPD, tobacco use (1PPD x 45 + years), hypertension, and hyperlipidemia returning to the clinic for follow-up of alcohol-induced neuropathy.  The patient was accompanied to the clinic by self.  IMPRESSION/PLAN: Alcohol-induced neuropathy, he continues to consume 3-6 beers nightly  - Continue gabapentin 600mg  BID - refilled  - He is not interested in reducing alcohol consumption  2.  Nutritional deficiency contributing to neuropathy - vitamin B12, thiamine, and folic acid   - Continue vitamin B12 , thiamine 100mg , and folic acid 1mg  daily  3.  Possible lumbar canal stenosis due to brisk reflexes in the legs.  He has mild low back pain, denies exertional leg weakness or radicular leg pain.  - Low threshold to get MRI lumbar spine if he develops worsening symptoms  Return to clinic in 1 year  ---------------------------------------------------- History of present illness: For the past 2-3 years, he has constant numbness/tingling involving the soles of the feet.  No associated pain.  He takes gabapentin 100mg  three times daily which may have helped some.  He has some back pain with prolonged standing.  No weakness in the legs.  He has some imbalance, no falls, and walks unassisted. NCS/EMG performed by Dr. 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  He drinks 3-6 beers daily for many years.  His labs indicated borderline-normal vitamin B12 and folate.  He is not on supplement or daily vitamin.   UPDATE 04/15/2021:  He is here for follow-up visit.  He is abrasive today and frustrated that no one can fix his neuropathy and despite taking medication, such as gabapentin 300mg -300mg -200mg , he gets  no relief. He has found the most benefit with wearing compression stockings and applying vicks vaporub to his feet.  He has no interest in reducing or abstaining from alcohol consumption.  He takes vitamin B12 and B1 supplement, but did not start folic acid.    UPDATE 06/28/2022:  He is here is follow-up visit. There has been no significant change in his neuropathy.  He feels that the right foot is worse.  He is taking gabapentin 600mg  twice daily which helps some.  He gets the most benefit with vicksvaporub.  He does not like to take medications and prefers natural options. He continues to drink 3-6 beers nightly and does not think alcohol is contributing to his neuropathy.   Medications:  Current Outpatient Medications on File Prior to Visit  Medication Sig Dispense Refill   Aspirin-Salicylamide-Caffeine (BC HEADACHE PO) Take by mouth.     gabapentin (NEURONTIN) 600 MG tablet TAKE ONE TABLET BY MOUTH TWICE (2) DAILY 180 tablet 0   lisinopril-hydrochlorothiazide (ZESTORETIC) 20-12.5 MG tablet Take 1 tablet by mouth 2 (two) times daily.     metoprolol succinate (TOPROL-XL) 50 MG 24 hr tablet Take 50 mg by mouth daily.     rosuvastatin (CRESTOR) 10 MG tablet Take by mouth.     SYMBICORT 160-4.5 MCG/ACT inhaler Inhale 2 puffs into the lungs 2 (two) times daily.     No current facility-administered medications on file prior to visit.    Allergies:  Allergies  Allergen Reactions   Bee Venom     Vital Signs:  BP (!) 163/94   Pulse 69  Ht 6' 3.5" (1.918 m)   Wt 220 lb (99.8 kg)   SpO2 92%   BMI 27.14 kg/m    Neurological Exam: MENTAL STATUS including orientation to time, place, person, recent and remote memory, attention span and concentration, language, and fund of knowledge is normal.  Speech is not dysarthric.  CRANIAL NERVES:   Normal conjugate, extra-ocular eye movements in all directions of gaze.  No ptosis. Face is symmetric.   MOTOR:  Motor strength is 5/5  throughout.  MSRs:  Reflexes are 2+/4 throughout, except 3+/4 bilateral patella.  SENSORY:  Reduced vibration distally in the feet  COORDINATION/GAIT:  Gait is mildly wide-based, stable, unassisted.  Stressed gait intact. Unable to perform tandem gait.   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   Labs 08/24/2020: vitamin B12 239, folate 3.5 Labs 12/10/2020:  vitamin B12 143*, folate 4.9*, vitamin B1 < 6*, TSH 2.37, SPEP with IFE no M protein    Thank you for allowing me to participate in patient's care.  If I can answer any additional questions, I would be pleased to do so.    Sincerely,    Bryler Dibble K. Allena Katz, DO

## 2022-06-28 NOTE — Patient Instructions (Addendum)
Continue gabapentin 600mg  twice daily  If you have worsening low back pain, let me know and we can consider MRI lumbar spine  Return to clinic in 1 year

## 2022-11-06 ENCOUNTER — Ambulatory Visit (HOSPITAL_COMMUNITY)
Admission: RE | Admit: 2022-11-06 | Discharge: 2022-11-06 | Disposition: A | Discharge status: Home / Self Care | Payer: Commercial Managed Care - PPO | Source: Ambulatory Visit | Attending: Acute Care | Admitting: Acute Care

## 2022-11-06 ENCOUNTER — Encounter (HOSPITAL_COMMUNITY): Payer: Self-pay

## 2022-11-06 DIAGNOSIS — F17210 Nicotine dependence, cigarettes, uncomplicated: Secondary | ICD-10-CM | POA: Diagnosis present

## 2022-11-06 DIAGNOSIS — Z122 Encounter for screening for malignant neoplasm of respiratory organs: Secondary | ICD-10-CM | POA: Insufficient documentation

## 2022-11-06 DIAGNOSIS — Z87891 Personal history of nicotine dependence: Secondary | ICD-10-CM | POA: Insufficient documentation

## 2022-11-23 ENCOUNTER — Telehealth: Payer: Self-pay | Admitting: Acute Care

## 2022-11-23 DIAGNOSIS — Z87891 Personal history of nicotine dependence: Secondary | ICD-10-CM

## 2022-11-23 DIAGNOSIS — F17210 Nicotine dependence, cigarettes, uncomplicated: Secondary | ICD-10-CM

## 2022-11-23 DIAGNOSIS — Z122 Encounter for screening for malignant neoplasm of respiratory organs: Secondary | ICD-10-CM

## 2022-11-23 NOTE — Telephone Encounter (Signed)
Contacted patient that results have not yet been released and results are slow coming in past few months. Patient states he has been billed for the scan so he felt he should have results by now.  Advised we would notify him as soon as results are reviewed by [place] and that the delay does not mean his results are abnormal.Patient verbalized understanding

## 2022-11-23 NOTE — Telephone Encounter (Signed)
Results not yet released as of 11/23/22. Will request reading from radiology

## 2022-11-23 NOTE — Telephone Encounter (Signed)
Pt calling for Ct Scan Results

## 2022-11-24 NOTE — Telephone Encounter (Signed)
Called and spoke to pt. Informed him of the results of LDCT, impression is below. Discussed the results in detail. Patient agreeable to annual LDCT. Order placed. Results sent to PCP.    IMPRESSION: 1. Lung-RADS 2, benign appearance or behavior. Continue annual screening with low-dose chest CT without contrast in 12 months. 2. Three-vessel coronary atherosclerosis. 3. Aortic Atherosclerosis (ICD10-I70.0) and Emphysema (ICD10-J43.9).     Electronically Signed   By: Delbert Phenix M.D.   On: 11/24/2022 08:47

## 2022-11-24 NOTE — Addendum Note (Signed)
Addended by: Sheran Luz on: 11/24/2022 09:41 AM   Modules accepted: Orders

## 2022-12-29 ENCOUNTER — Other Ambulatory Visit: Payer: Self-pay | Admitting: Orthopedic Surgery

## 2023-01-18 ENCOUNTER — Encounter (HOSPITAL_BASED_OUTPATIENT_CLINIC_OR_DEPARTMENT_OTHER): Payer: Self-pay | Admitting: Orthopedic Surgery

## 2023-01-19 ENCOUNTER — Encounter (HOSPITAL_BASED_OUTPATIENT_CLINIC_OR_DEPARTMENT_OTHER)
Admission: RE | Admit: 2023-01-19 | Discharge: 2023-01-19 | Disposition: A | Discharge status: Home / Self Care | Payer: Commercial Managed Care - PPO | Source: Ambulatory Visit | Attending: Orthopedic Surgery | Admitting: Orthopedic Surgery

## 2023-01-19 DIAGNOSIS — Z01818 Encounter for other preprocedural examination: Secondary | ICD-10-CM | POA: Insufficient documentation

## 2023-01-19 LAB — COMPREHENSIVE METABOLIC PANEL
ALT: 52 U/L — ABNORMAL HIGH (ref 0–44)
AST: 40 U/L (ref 15–41)
Albumin: 3.8 g/dL (ref 3.5–5.0)
Alkaline Phosphatase: 69 U/L (ref 38–126)
Anion gap: 8 (ref 5–15)
BUN: 10 mg/dL (ref 8–23)
CO2: 30 mmol/L (ref 22–32)
Calcium: 9.6 mg/dL (ref 8.9–10.3)
Chloride: 103 mmol/L (ref 98–111)
Creatinine, Ser: 1.26 mg/dL — ABNORMAL HIGH (ref 0.61–1.24)
GFR, Estimated: >60 mL/min (ref >60)
Glucose, Bld: 101 mg/dL — ABNORMAL HIGH (ref 70–99)
Potassium: 4.4 mmol/L (ref 3.5–5.1)
Sodium: 141 mmol/L (ref 135–145)
Total Bilirubin: 0.8 mg/dL (ref <1.2)
Total Protein: 7 g/dL (ref 6.5–8.1)

## 2023-01-19 NOTE — Progress Notes (Signed)

## 2023-01-25 ENCOUNTER — Encounter (HOSPITAL_BASED_OUTPATIENT_CLINIC_OR_DEPARTMENT_OTHER)
Admission: RE | Disposition: A | Discharge status: Home / Self Care | Payer: Self-pay | Source: Home / Self Care | Attending: Orthopedic Surgery

## 2023-01-25 ENCOUNTER — Ambulatory Visit (HOSPITAL_BASED_OUTPATIENT_CLINIC_OR_DEPARTMENT_OTHER): Payer: Commercial Managed Care - PPO | Admitting: Anesthesiology

## 2023-01-25 ENCOUNTER — Encounter (HOSPITAL_BASED_OUTPATIENT_CLINIC_OR_DEPARTMENT_OTHER): Payer: Self-pay | Admitting: Orthopedic Surgery

## 2023-01-25 ENCOUNTER — Other Ambulatory Visit: Payer: Self-pay

## 2023-01-25 ENCOUNTER — Ambulatory Visit (HOSPITAL_BASED_OUTPATIENT_CLINIC_OR_DEPARTMENT_OTHER)
Admission: RE | Admit: 2023-01-25 | Discharge: 2023-01-25 | Disposition: A | Discharge status: Home / Self Care | Payer: Commercial Managed Care - PPO | Attending: Orthopedic Surgery | Admitting: Orthopedic Surgery

## 2023-01-25 DIAGNOSIS — I10 Essential (primary) hypertension: Secondary | ICD-10-CM | POA: Diagnosis not present

## 2023-01-25 DIAGNOSIS — J449 Chronic obstructive pulmonary disease, unspecified: Secondary | ICD-10-CM | POA: Insufficient documentation

## 2023-01-25 DIAGNOSIS — M720 Palmar fascial fibromatosis [Dupuytren]: Secondary | ICD-10-CM | POA: Diagnosis present

## 2023-01-25 DIAGNOSIS — Z7951 Long term (current) use of inhaled steroids: Secondary | ICD-10-CM | POA: Diagnosis not present

## 2023-01-25 DIAGNOSIS — F1290 Cannabis use, unspecified, uncomplicated: Secondary | ICD-10-CM | POA: Diagnosis not present

## 2023-01-25 DIAGNOSIS — F17210 Nicotine dependence, cigarettes, uncomplicated: Secondary | ICD-10-CM | POA: Diagnosis not present

## 2023-01-25 DIAGNOSIS — Z01818 Encounter for other preprocedural examination: Secondary | ICD-10-CM

## 2023-01-25 HISTORY — PX: DUPUYTREN CONTRACTURE RELEASE: SHX1478

## 2023-01-25 SURGERY — RELEASE, DUPUYTREN CONTRACTURE
Anesthesia: General | Site: Hand | Laterality: Left

## 2023-01-25 MED ORDER — THROMBIN 5000 UNITS EX SOLR
CUTANEOUS | Status: AC
  Filled 2023-01-25: qty 5000

## 2023-01-25 MED ORDER — FENTANYL CITRATE (PF) 100 MCG/2ML IJ SOLN
INTRAMUSCULAR | Status: AC
  Filled 2023-01-25: qty 2

## 2023-01-25 MED ORDER — ATROPINE SULFATE 0.4 MG/ML IV SOLN
INTRAVENOUS | Status: AC
Start: 2023-01-25 — End: ?
  Filled 2023-01-25: qty 1

## 2023-01-25 MED ORDER — PHENYLEPHRINE 80 MCG/ML (10ML) SYRINGE FOR IV PUSH (FOR BLOOD PRESSURE SUPPORT)
PREFILLED_SYRINGE | INTRAVENOUS | Status: DC | PRN
  Administered 2023-01-25: 80 ug via INTRAVENOUS
  Administered 2023-01-25: 160 ug via INTRAVENOUS
  Administered 2023-01-25: 300 ug via INTRAVENOUS

## 2023-01-25 MED ORDER — SODIUM CHLORIDE 0.9 % IV SOLN: INTRAVENOUS | Status: DC | PRN

## 2023-01-25 MED ORDER — CEFAZOLIN SODIUM-DEXTROSE 2-4 GM/100ML-% IV SOLN
INTRAVENOUS | Status: AC
  Filled 2023-01-25: qty 100

## 2023-01-25 MED ORDER — THROMBIN 5000 UNITS EX SOLR
CUTANEOUS | Status: DC | PRN
  Administered 2023-01-25: 5000 [IU] via TOPICAL

## 2023-01-25 MED ORDER — CEFAZOLIN SODIUM-DEXTROSE 2-4 GM/100ML-% IV SOLN
2.0000 g | INTRAVENOUS | Status: AC
Start: 2023-01-25 — End: 2023-01-25
  Administered 2023-01-25: 2 g via INTRAVENOUS

## 2023-01-25 MED ORDER — ONDANSETRON HCL 4 MG/2ML IJ SOLN
INTRAMUSCULAR | Status: DC | PRN
  Administered 2023-01-25: 4 mg via INTRAVENOUS

## 2023-01-25 MED ORDER — ONDANSETRON HCL 4 MG/2ML IJ SOLN
INTRAMUSCULAR | Status: AC
  Filled 2023-01-25: qty 2

## 2023-01-25 MED ORDER — FENTANYL CITRATE (PF) 100 MCG/2ML IJ SOLN
INTRAMUSCULAR | Status: DC | PRN
  Administered 2023-01-25 (×2): 50 ug via INTRAVENOUS
  Administered 2023-01-25 (×2): 25 ug via INTRAVENOUS
  Administered 2023-01-25: 50 ug via INTRAVENOUS

## 2023-01-25 MED ORDER — DEXMEDETOMIDINE HCL IN NACL 80 MCG/20ML IV SOLN
INTRAVENOUS | Status: AC
Start: 2023-01-25 — End: ?
  Filled 2023-01-25: qty 20

## 2023-01-25 MED ORDER — EPHEDRINE 5 MG/ML INJ
INTRAVENOUS | Status: AC
  Filled 2023-01-25: qty 10

## 2023-01-25 MED ORDER — 0.9 % SODIUM CHLORIDE (POUR BTL) OPTIME
TOPICAL | Status: DC | PRN
  Administered 2023-01-25: 500 mL

## 2023-01-25 MED ORDER — PROPOFOL 10 MG/ML IV BOLUS
INTRAVENOUS | Status: AC
Start: 2023-01-25 — End: ?
  Filled 2023-01-25: qty 20

## 2023-01-25 MED ORDER — LIDOCAINE 2% (20 MG/ML) 5 ML SYRINGE
INTRAMUSCULAR | Status: DC | PRN
  Administered 2023-01-25: 60 mg via INTRAVENOUS

## 2023-01-25 MED ORDER — DEXAMETHASONE SODIUM PHOSPHATE 10 MG/ML IJ SOLN
INTRAMUSCULAR | Status: DC | PRN
  Administered 2023-01-25: 10 mg via INTRAVENOUS

## 2023-01-25 MED ORDER — KETOROLAC TROMETHAMINE 30 MG/ML IJ SOLN
INTRAMUSCULAR | Status: AC
  Filled 2023-01-25: qty 1

## 2023-01-25 MED ORDER — PROPOFOL 10 MG/ML IV BOLUS
INTRAVENOUS | Status: AC
  Filled 2023-01-25: qty 20

## 2023-01-25 MED ORDER — PROPOFOL 10 MG/ML IV BOLUS
INTRAVENOUS | Status: DC | PRN
  Administered 2023-01-25: 60 mg via INTRAVENOUS
  Administered 2023-01-25: 300 mg via INTRAVENOUS

## 2023-01-25 MED ORDER — LIDOCAINE 2% (20 MG/ML) 5 ML SYRINGE
INTRAMUSCULAR | Status: AC
  Filled 2023-01-25: qty 5

## 2023-01-25 MED ORDER — MIDAZOLAM HCL 5 MG/5ML IJ SOLN
INTRAMUSCULAR | Status: DC | PRN
  Administered 2023-01-25: 2 mg via INTRAVENOUS

## 2023-01-25 MED ORDER — ACETAMINOPHEN 500 MG PO TABS
ORAL_TABLET | ORAL | Status: AC
Start: 2023-01-25 — End: ?
  Filled 2023-01-25: qty 2

## 2023-01-25 MED ORDER — MIDAZOLAM HCL 2 MG/2ML IJ SOLN
INTRAMUSCULAR | Status: AC
Start: 2023-01-25 — End: ?
  Filled 2023-01-25: qty 2

## 2023-01-25 MED ORDER — ACETAMINOPHEN 500 MG PO TABS
1000.0000 mg | ORAL_TABLET | Freq: Once | ORAL | Status: AC
  Administered 2023-01-25: 1000 mg via ORAL

## 2023-01-25 MED ORDER — PHENYLEPHRINE 80 MCG/ML (10ML) SYRINGE FOR IV PUSH (FOR BLOOD PRESSURE SUPPORT)
PREFILLED_SYRINGE | INTRAVENOUS | Status: AC
  Filled 2023-01-25: qty 10

## 2023-01-25 MED ORDER — BUPIVACAINE HCL (PF) 0.25 % IJ SOLN
INTRAMUSCULAR | Status: DC | PRN
  Administered 2023-01-25: 9 mL

## 2023-01-25 MED ORDER — LACTATED RINGERS IV SOLN: INTRAVENOUS | Status: DC

## 2023-01-25 MED ORDER — SUCCINYLCHOLINE CHLORIDE 200 MG/10ML IV SOSY
PREFILLED_SYRINGE | INTRAVENOUS | Status: AC
  Filled 2023-01-25: qty 10

## 2023-01-25 MED ORDER — HYDROCODONE-ACETAMINOPHEN 5-325 MG PO TABS: ORAL_TABLET | ORAL | 0 refills | Status: DC

## 2023-01-25 SURGICAL SUPPLY — 42 items
BLADE MINI RND TIP GREEN BEAV (BLADE) IMPLANT
BLADE SURG 15 STRL LF DISP TIS (BLADE) ×4 IMPLANT
BNDG ELASTIC 3INX 5YD STR LF (GAUZE/BANDAGES/DRESSINGS) ×2 IMPLANT
BNDG ESMARK 4X9 LF (GAUZE/BANDAGES/DRESSINGS) IMPLANT
BNDG GAUZE DERMACEA FLUFF 4 (GAUZE/BANDAGES/DRESSINGS) ×2 IMPLANT
CHLORAPREP W/TINT 26 (MISCELLANEOUS) ×2 IMPLANT
CORD BIPOLAR FORCEPS 12FT (ELECTRODE) ×2 IMPLANT
COVER BACK TABLE 60X90IN (DRAPES) ×2 IMPLANT
COVER MAYO STAND STRL (DRAPES) ×2 IMPLANT
CUFF TOURN SGL QUICK 18X4 (TOURNIQUET CUFF) ×2 IMPLANT
DRAPE EXTREMITY T 121X128X90 (DISPOSABLE) ×2 IMPLANT
DRAPE SURG 17X23 STRL (DRAPES) ×2 IMPLANT
GAUZE SPONGE 4X4 12PLY STRL (GAUZE/BANDAGES/DRESSINGS) ×2 IMPLANT
GAUZE XEROFORM 1X8 LF (GAUZE/BANDAGES/DRESSINGS) ×2 IMPLANT
GLOVE BIO SURGEON STRL SZ7.5 (GLOVE) ×2 IMPLANT
GLOVE BIOGEL PI IND STRL 6.5 (GLOVE) IMPLANT
GLOVE BIOGEL PI IND STRL 7.0 (GLOVE) IMPLANT
GLOVE BIOGEL PI IND STRL 8 (GLOVE) IMPLANT
GLOVE SURG ORTHO 8.0 STRL STRW (GLOVE) IMPLANT
GLOVE SURG SS PI 6.5 STRL IVOR (GLOVE) IMPLANT
GLOVE SURG SS PI 7.0 STRL IVOR (GLOVE) IMPLANT
GOWN STRL REUS W/ TWL LRG LVL3 (GOWN DISPOSABLE) ×2 IMPLANT
GOWN STRL REUS W/ TWL XL LVL3 (GOWN DISPOSABLE) IMPLANT
GOWN STRL REUS W/TWL XL LVL3 (GOWN DISPOSABLE) ×2 IMPLANT
LOOP VASCLR MAXI BLUE 18IN ST (MISCELLANEOUS) IMPLANT
LOOPS VASCLR MAXI BLUE 18IN ST (MISCELLANEOUS) ×1 IMPLANT
NDL HYPO 25X1 1.5 SAFETY (NEEDLE) ×2 IMPLANT
NEEDLE HYPO 25X1 1.5 SAFETY (NEEDLE) ×1 IMPLANT
NS IRRIG 1000ML POUR BTL (IV SOLUTION) ×2 IMPLANT
PACK BASIN DAY SURGERY FS (CUSTOM PROCEDURE TRAY) ×2 IMPLANT
PAD CAST 3X4 CTTN HI CHSV (CAST SUPPLIES) ×2 IMPLANT
PADDING CAST ABS COTTON 4X4 ST (CAST SUPPLIES) ×2 IMPLANT
SPLINT PLASTER CAST XFAST 3X15 (CAST SUPPLIES) IMPLANT
STOCKINETTE 4X48 STRL (DRAPES) ×2 IMPLANT
SUT ETHILON 4 0 PS 2 18 (SUTURE) ×2 IMPLANT
SUT SILK 4 0 PS 2 (SUTURE) IMPLANT
SYR BULB EAR ULCER 3OZ GRN STR (SYRINGE) ×2 IMPLANT
SYR CONTROL 10ML LL (SYRINGE) ×2 IMPLANT
TIE VASCULAR MAXI BLUE 18IN ST (MISCELLANEOUS) ×1 IMPLANT
TOWEL GREEN STERILE FF (TOWEL DISPOSABLE) ×4 IMPLANT
UNDERPAD 30X36 HEAVY ABSORB (UNDERPADS AND DIAPERS) ×2 IMPLANT
VASCULAR TIE MAXI BLUE 18IN ST (MISCELLANEOUS) ×1

## 2023-01-25 NOTE — Op Note (Signed)
NAME: Timothy GIFFEN Sr. MEDICAL RECORD NO: [id number] DATE OF BIRTH: January 01, 1961 FACILITY: [facility] LOCATION: [facility] PHYSICIAN: Tami Ribas, MD   OPERATIVE REPORT   DATE OF PROCEDURE: 01/25/23    PREOPERATIVE DIAGNOSIS: Left ring finger Dupuytren's contracture   POSTOPERATIVE DIAGNOSIS: Left ring finger Dupuytren's contracture   PROCEDURE: Left ring finger palmar and digital fasciectomy   SURGEON:  Betha Loa, M.D.   ASSISTANT: Cindee Salt, MD   ANESTHESIA:  General   INTRAVENOUS FLUIDS:  Per anesthesia flow sheet.   ESTIMATED BLOOD LOSS:  Minimal.   COMPLICATIONS:  None.   SPECIMENS: Left ring finger palmar fascia to pathology   TOURNIQUET TIME:    Total Tourniquet Time Documented: Upper Arm (Left) - 97 minutes Total: Upper Arm (Left) - 97 minutes    DISPOSITION:  Stable to PACU.   INDICATIONS: 62 year old male with contracture of left ring finger due to Dupuytren's contracture.  He wishes to proceed with the palmar and digital fasciectomy.  Risks, benefits and alternatives of surgery were discussed including the risks of blood loss, infection, damage to nerves, vessels, tendons, ligaments, bone for surgery, need for additional surgery, complications with wound healing, continued pain, stiffness, , recurrence.  He voiced understanding of these risks and elected to proceed.  OPERATIVE COURSE:  After being identified preoperatively by myself,  the patient and I agreed on the procedure and site of the procedure.  The surgical site was marked.  Surgical consent had been signed. Preoperative IV antibiotic prophylaxis was given. He was transferred to the operating room and placed on the operating table in supine position with the Left upper extremity on an arm board.  General anesthesia was induced by the anesthesiologist.  Left upper extremity was prepped and draped in normal sterile orthopedic fashion.  A surgical pause was performed between the surgeons,  anesthesia, and operating room staff and all were in agreement as to the patient, procedure, and site of procedure.  Tourniquet at the proximal aspect of the extremity was inflated to 250 mmHg after exsanguination of the arm with an Esmarch bandage.  Bruner type incision was started in the palm over the cord.  This was carried in subcutaneous tissues by spreading technique.  Bipolar electrocautery is used to obtain hemostasis.  The cord was very distinct.  The common digital neurovascular structures were identified on the radial and ulnar sides and protected.  The cord was transected proximally.  It was carefully freed up and a proximal to distal direction while tracing the neurovascular bundles.  The incision was extended distally into the finger progressively as necessary.  The incision went into the middle phalanx eventually.  The cord was carefully freed up while protecting the neurovascular bundles on both the radial and ulnar sides.  There was spiral cord on both the radial and ulnar sides.  The palmar cord was removed and sent to pathology for examination.  There was still contracture of the PIP joint.  The flexor sheath was released and the volar plate released to aid in better extension of the finger.  Near complete extension was achieved.  The wound was copiously irrigated with sterile saline.  It was then treated with thrombin.  The corners of the incision were deepened to allow advancement of the skin.  Vessel loop drains were placed in the wound and the skin closed with 4-0 nylon in a horizontal mattress fashion.  Digital block was performed with quarter percent plain Marcaine to aid in postoperative analgesia.  Wound was dressed with sterile Xeroform 4 x 4's and wrapped with a Kerlix bandage.  The tourniquet was deflated at 97 minutes.  Fingertips were pink with brisk capillary refill after deflation of tourniquet.  A volar splint was placed and wrapped with Kerlix and Ace bandage.  The operative   drapes were broken down.  The patient was awoken from anesthesia safely.  He was transferred back to the stretcher and taken to PACU in stable condition.  I will see him back in the office in 1 week for postoperative followup.  I will give him a prescription for Norco 5/325 1-2 tabs PO q6 hours prn pain, dispense # 25.   Betha Loa, MD Electronically signed, 01/25/23

## 2023-01-25 NOTE — Anesthesia Preprocedure Evaluation (Addendum)
Anesthesia Evaluation  Patient identified by MRN, date of birth, ID band Patient awake    Reviewed: Allergy & Precautions, NPO status , Patient's Chart, lab work & pertinent test results, reviewed documented beta blocker date and time   Airway Mallampati: I  TM Distance: >3 FB Neck ROM: Full    Dental  (+) Dental Advisory Given, Missing,    Pulmonary COPD,  COPD inhaler, Current Smoker and Patient abstained from smoking.   Pulmonary exam normal breath sounds clear to auscultation       Cardiovascular hypertension, Pt. on home beta blockers and Pt. on medications Normal cardiovascular exam Rhythm:Regular Rate:Normal     Neuro/Psych negative neurological ROS  negative psych ROS   GI/Hepatic negative GI ROS,,,(+)     substance abuse  alcohol use and marijuana use  Endo/Other  negative endocrine ROS    Renal/GU negative Renal ROS  negative genitourinary   Musculoskeletal negative musculoskeletal ROS (+)    Abdominal   Peds  Hematology negative hematology ROS (+)   Anesthesia Other Findings   Reproductive/Obstetrics                             Anesthesia Physical Anesthesia Plan  ASA: 2  Anesthesia Plan: General   Post-op Pain Management: Tylenol PO (pre-op)*   Induction: Intravenous  PONV Risk Score and Plan: 1 and Ondansetron, Dexamethasone and Midazolam  Airway Management Planned: LMA  Additional Equipment:   Intra-op Plan:   Post-operative Plan: Extubation in OR  Informed Consent: I have reviewed the patients History and Physical, chart, labs and discussed the procedure including the risks, benefits and alternatives for the proposed anesthesia with the patient or authorized representative who has indicated his/her understanding and acceptance.     Dental advisory given  Plan Discussed with: CRNA  Anesthesia Plan Comments:        Anesthesia Quick Evaluation

## 2023-01-25 NOTE — Anesthesia Procedure Notes (Signed)
Procedure Name: LMA Insertion Date/Time: 01/25/2023 11:46 AM  Performed by: Roosvelt Harps, CRNAPre-anesthesia Checklist: Patient identified, Emergency Drugs available, Suction available and Patient being monitored Patient Re-evaluated:Patient Re-evaluated prior to induction Oxygen Delivery Method: Circle System Utilized Preoxygenation: Pre-oxygenation with 100% oxygen Induction Type: IV induction Ventilation: Mask ventilation without difficulty LMA: LMA inserted LMA Size: 5.0 Number of attempts: 1 Airway Equipment and Method: Bite block Placement Confirmation: positive ETCO2 and breath sounds checked- equal and bilateral Tube secured with: Tape Dental Injury: Teeth and Oropharynx as per pre-operative assessment

## 2023-01-25 NOTE — Op Note (Signed)
I assisted Surgeons and Role:    Betha Loa, MD - Primary    Cindee Salt, MD - Assisting on the Procedure(s): excision Dupuytrens contracture involving left palm left ring finger and thumb and index web space on 01/25/2023.  I provided assistance on this case as follows: Set up, approach, notification of the cord, isolation of the neurovascular bundles with protection, isolation of the cord, resection of the cord and release of the joint volar plate and flexor sheath, creation of VY advancement flaps with advancement, closure of the wound and application of the dressing and splint.  Electronically signed by: Cindee Salt, MD Date: 01/25/2023 Time: 1:49 PM

## 2023-01-25 NOTE — Discharge Instructions (Addendum)

## 2023-01-25 NOTE — Transfer of Care (Signed)
Immediate Anesthesia Transfer of Care Note  Patient: Timothy BRUMBLEY Sr.  Procedure(s) Performed: excision Dupuytrens contracture involving left palm left ring finger and thumb and index web space (Left: Hand)  Patient Location: PACU  Anesthesia Type:General  Level of Consciousness: drowsy  Airway & Oxygen Therapy: Patient Spontanous Breathing and Patient connected to face mask oxygen  Post-op Assessment: Report given to RN and Post -op Vital signs reviewed and stable  Post vital signs: Reviewed and stable  Last Vitals:  Vitals Value Taken Time  BP 141/95 01/25/23 1355  Temp    Pulse 71 01/25/23 1356  Resp 15 01/25/23 1356  SpO2 95 % 01/25/23 1356  Vitals shown include unfiled device data.  Last Pain:  Vitals:   01/25/23 0809  TempSrc: Temporal  PainSc: 0-No pain         Complications: No notable events documented.

## 2023-01-25 NOTE — H&P (Addendum)
Timothy Bogus Sr. is an 63 y.o. male.   Chief Complaint: dupuytrens contracture HPI: 62 yo male with left ring finger and thumb/index webspace dupuytrens contracture.  It is bothersome to him.  He has difficulty putting his hand in a pocket or glove.  He wishes to proceed with surgical fasciectomy.  Allergies:  Allergies  Allergen Reactions   Bee Venom     Past Medical History:  Diagnosis Date   COPD (chronic obstructive pulmonary disease) (HCC)    Dupuytren's contracture of right hand    Hypertension    Smoker     Past Surgical History:  Procedure Laterality Date   DUPUYTREN CONTRACTURE RELEASE Right 02/13/2020   Procedure: DUPUYTREN CONTRACTURE RELEASE RIGHT PALM AND RIGHT RING FINGER AND POSSIBLE MIDDLE FINGER;  Surgeon: Cindee Salt, MD;  Location: [facility];  Service: Orthopedics;  Laterality: Right;  AXILLARY BLOCK   SPLENECTOMY, TOTAL     at 62 years of age s/p fall from building    Family History: Family History  Problem Relation Age of Onset   Breast cancer Mother    Hypertension Mother    Hypertension Father    Dementia Father    Parkinson's disease Father     Social History:   reports that he has been smoking cigarettes. He has never used smokeless tobacco. He reports current alcohol use of about 12.0 standard drinks of alcohol per week. He reports current drug use. Frequency: 1.00 time per week. Drug: Marijuana.  Medications: Medications Prior to Admission  Medication Sig Dispense Refill   gabapentin (NEURONTIN) 600 MG tablet Take 1 tablet (600 mg total) by mouth 2 (two) times daily. 180 tablet 3   lisinopril-hydrochlorothiazide (ZESTORETIC) 20-12.5 MG tablet Take 1 tablet by mouth 2 (two) times daily.     metoprolol succinate (TOPROL-XL) 50 MG 24 hr tablet Take 50 mg by mouth daily.     rosuvastatin (CRESTOR) 10 MG tablet Take by mouth.     SYMBICORT 160-4.5 MCG/ACT inhaler Inhale 2 puffs into the lungs 2 (two) times daily.     tiotropium  (SPIRIVA) 18 MCG inhalation capsule Place 18 mcg into inhaler and inhale daily.      No results found for this or any previous visit (from the past 48 hours).  No results found.    Blood pressure (!) 153/88, pulse 74, temperature 98.2 F (36.8 C), temperature source Temporal, resp. rate 14, height 6\' 4"  (1.93 m), weight 101.2 kg, SpO2 96%.  General appearance: alert, cooperative, and appears stated age Head: Normocephalic, without obvious abnormality, atraumatic Neck: supple, symmetrical, trachea midline Extremities: Intact sensation and capillary refill all digits.  +epl/fpl/io.  No wounds.  Pulses: 2+ and symmetric Skin: Skin color, texture, turgor normal. No rashes or lesions Neurologic: Grossly normal Incision/Wound: none  Assessment/Plan Left ring finger and thumb index web dupuytrens contracture.  Non operative and operative treatment options have been discussed with the patient and patient wishes to proceed with operative treatment. Risks, benefits and alternatives of surgery were discussed including risks of blood loss, infection, damage to nerves/vessels/tendons/ligament/bone, failure of surgery, need for additional surgery, complication with wound healing, stiffness, recurrence.  He voiced understanding of these risks and elected to proceed.    Betha Loa 01/25/2023, 9:06 AM

## 2023-01-26 ENCOUNTER — Encounter (HOSPITAL_BASED_OUTPATIENT_CLINIC_OR_DEPARTMENT_OTHER): Payer: Self-pay | Admitting: Orthopedic Surgery

## 2023-01-26 NOTE — Anesthesia Postprocedure Evaluation (Signed)
Anesthesia Post Note  Patient: JMAR KOPKE Sr.  Procedure(s) Performed: excision Dupuytrens contracture involving left palm left ring finger and thumb and index web space (Left: Hand)     Patient location during evaluation: PACU Anesthesia Type: General Level of consciousness: awake and alert Pain management: pain level controlled Vital Signs Assessment: post-procedure vital signs reviewed and stable Respiratory status: spontaneous breathing, nonlabored ventilation, respiratory function stable and patient connected to nasal cannula oxygen Cardiovascular status: blood pressure returned to baseline and stable Postop Assessment: no apparent nausea or vomiting Anesthetic complications: no  No notable events documented.  Last Vitals:  Vitals:   01/25/23 1415 01/25/23 1424  BP: (!) 150/78 (!) 166/80  Pulse: 71 72  Resp: 18 19  Temp:  36.9 C  SpO2: 90% 91%    Last Pain:  Vitals:   01/26/23 0910  TempSrc:   PainSc: 2                  Marirose Deveney L Nyna Chilton

## 2023-01-29 LAB — SURGICAL PATHOLOGY

## 2023-02-28 NOTE — Progress Notes (Unsigned)
No chief complaint on file.  History of Present Illness: 63 yo male with history of CAD, COPD, etoh abuse, HTN, HLD and tobacco abuse who is here today as a new consult, referred by ***, for the evaluation of ***.   Primary Care Physician: Milus Height, PA   Past Medical History:  Diagnosis Date   Atherosclerosis    B12 deficiency    CAD (coronary artery disease)    COPD (chronic obstructive pulmonary disease) (HCC)    Dupuytren's contracture of right hand    ED (erectile dysfunction)    Emphysema, unspecified (HCC)    ETOH abuse    Folate deficiency    History of colon polyps    Hypercholesterolemia    Hypertension    Neuropathy    Smoker    Tobacco dependence     Past Surgical History:  Procedure Laterality Date   DUPUYTREN CONTRACTURE RELEASE Right 02/13/2020   Procedure: DUPUYTREN CONTRACTURE RELEASE RIGHT PALM AND RIGHT RING FINGER AND POSSIBLE MIDDLE FINGER;  Surgeon: Cindee Salt, MD;  Location: [facility];  Service: Orthopedics;  Laterality: Right;  AXILLARY BLOCK   DUPUYTREN CONTRACTURE RELEASE Left 01/25/2023   Procedure: excision Dupuytrens contracture involving left palm left ring finger and thumb and index web space;  Surgeon: Betha Loa, MD;  Location: [facility];  Service: Orthopedics;  Laterality: Left;   SPLENECTOMY, TOTAL     at 63 years of age s/p fall from building    Current Outpatient Medications  Medication Sig Dispense Refill   gabapentin (NEURONTIN) 600 MG tablet Take 1 tablet (600 mg total) by mouth 2 (two) times daily. 180 tablet 3   HYDROcodone-acetaminophen (NORCO) 5-325 MG tablet 1-2 tabs po q6 hours prn pain 25 tablet 0   lisinopril-hydrochlorothiazide (ZESTORETIC) 20-12.5 MG tablet Take 1 tablet by mouth 2 (two) times daily.     metoprolol succinate (TOPROL-XL) 50 MG 24 hr tablet Take 50 mg by mouth daily.     rosuvastatin (CRESTOR) 10 MG tablet Take by mouth.     SYMBICORT 160-4.5 MCG/ACT inhaler Inhale 2  puffs into the lungs 2 (two) times daily.     tiotropium (SPIRIVA) 18 MCG inhalation capsule Place 18 mcg into inhaler and inhale daily.     No current facility-administered medications for this visit.    Allergies  Allergen Reactions   Bee Venom     Social History   Socioeconomic History   Marital status: Divorced    Spouse name: Not on file   Number of children: 4   Years of education: Not on file   Highest education level: Not on file  Occupational History   Not on file  Tobacco Use   Smoking status: Every Day    Current packs/day: 1.00    Types: Cigarettes   Smokeless tobacco: Never  Vaping Use   Vaping status: Never Used  Substance and Sexual Activity   Alcohol use: Yes    Alcohol/week: 12.0 standard drinks of alcohol    Types: 12 Cans of beer per week    Comment: daily beer "depends"   Drug use: Yes    Frequency: 1.0 times per week    Types: Marijuana    Comment: smokes marijuana weekly   Sexual activity: Not on file  Other Topics Concern   Not on file  Social History Narrative   Right Handed    Lives in a one story home    Social Drivers of Health   Financial  Resource Strain: Not on file  Food Insecurity: Low Risk  (02/16/2023)   Received from [facility]   Hunger Vital Sign    Worried About Running Out of Food in the Last Year: Never true    Ran Out of Food in the Last Year: Never true  Transportation Needs: No Transportation Needs (02/16/2023)   Received from [facility]    In the past 12 months, has lack of reliable transportation kept you from medical appointments, meetings, work or from getting things needed for daily living? : No  Physical Activity: Not on file  Stress: Not on file  Social Connections: Not on file  Intimate Partner Violence: Not on file    Family History  Problem Relation Age of Onset   Breast cancer Mother    Hypertension Mother    Hypertension Father    Dementia Father    Parkinson's disease Father      Review of Systems:  As stated in the HPI and otherwise negative.   There were no vitals taken for this visit.  Physical Examination: General: Well developed, well nourished, NAD  HEENT: OP clear, mucus membranes moist  SKIN: warm, dry. No rashes. Neuro: No focal deficits  Musculoskeletal: Muscle strength 5/5 all ext  Psychiatric: Mood and affect normal  Neck: No JVD, no carotid bruits, no thyromegaly, no lymphadenopathy.  Lungs:Clear bilaterally, no wheezes, rhonci, crackles Cardiovascular: Regular rate and rhythm. No murmurs, gallops or rubs. Abdomen:Soft. Bowel sounds present. Non-tender.  Extremities: No lower extremity edema. Pulses are 2 + in the bilateral DP/PT.  EKG:  EKG {ACTION; IS/IS [id number]} ordered today. The ekg ordered today demonstrates ***  Recent Labs: 01/19/2023: ALT 52; BUN 10; Creatinine, Ser 1.26; Potassium 4.4; Sodium 141   Lipid Panel No results found for: "CHOL", "TRIG", "HDL", "CHOLHDL", "VLDL", "LDLCALC", "LDLDIRECT"   Wt Readings from Last 3 Encounters:  01/25/23 101.2 kg  06/28/22 99.8 kg  04/15/21 99.3 kg      Assessment and Plan:   1.   Labs/ tests ordered today include:  No orders of the defined types were placed in this encounter.    Disposition:   F/U with me in ***    Signed, Verne Carrow, MD, [facility] 02/28/2023 2:54 PM    [facility] [address] [place], [place], [place]  [address] Phone: [phone]; Fax: [phone]

## 2023-03-01 ENCOUNTER — Encounter: Payer: Self-pay | Admitting: Cardiovascular Disease

## 2023-03-01 ENCOUNTER — Ambulatory Visit: Payer: Commercial Managed Care - PPO | Attending: Cardiovascular Disease | Admitting: Cardiovascular Disease

## 2023-03-01 ENCOUNTER — Other Ambulatory Visit: Payer: Self-pay

## 2023-03-01 VITALS — BP 150/90 | HR 70 | Ht 76.0 in | Wt 229.0 lb

## 2023-03-01 DIAGNOSIS — I25118 Atherosclerotic heart disease of native coronary artery with other forms of angina pectoris: Secondary | ICD-10-CM

## 2023-03-01 LAB — BASIC METABOLIC PANEL
BUN/Creatinine Ratio: 7 — ABNORMAL LOW (ref 10–24)
BUN: 7 mg/dL — ABNORMAL LOW (ref 8–27)
CO2: 27 mmol/L (ref 20–29)
Calcium: 9.9 mg/dL (ref 8.6–10.2)
Chloride: 99 mmol/L (ref 96–106)
Creatinine, Ser: 1 mg/dL (ref 0.76–1.27)
Glucose: 92 mg/dL (ref 70–99)
Potassium: 4.8 mmol/L (ref 3.5–5.2)
Sodium: 141 mmol/L (ref 134–144)
eGFR: 85 mL/min/{1.73_m2} (ref >59)

## 2023-03-01 MED ORDER — METOPROLOL TARTRATE 100 MG PO TABS: 100.0000 mg | ORAL_TABLET | Freq: Once | ORAL | 0 refills | Status: AC

## 2023-03-01 NOTE — Patient Instructions (Addendum)
Medication Instructions:  No changes today *If you need a refill on your cardiac medications before your next appointment, please call your pharmacy*   Lab Work: Bmet at the [facility] first floor today.  If you have labs (blood work) drawn today and your tests are completely normal, you will receive your results only by: MyChart Message (if you have MyChart) OR A paper copy in the mail If you have any lab test that is abnormal or we need to change your treatment, we will call you to review the results.   Testing/Procedures: Your physician has requested that you have an echocardiogram. Echocardiography is a painless test that uses sound waves to create images of your heart. It provides your doctor with information about the size and shape of your heart and how well your heart's chambers and valves are working. This procedure takes approximately one hour. There are no restrictions for this procedure. Please do NOT wear cologne, perfume, aftershave, or lotions (deodorant is allowed). Please arrive 15 minutes prior to your appointment time.  Please note: We ask at that you not bring children with you during ultrasound (echo/ vascular) testing. Due to room size and safety concerns, children are not allowed in the ultrasound rooms during exams. Our front office staff cannot provide observation of children in our lobby area while testing is being conducted. An adult accompanying a patient to their appointment will only be allowed in the ultrasound room at the discretion of the ultrasound technician under special circumstances. We apologize for any inconvenience.  Your physician has requested that you have cardiac CT. Cardiac computed tomography (CT) is a painless test that uses an x-ray machine to take clear, detailed pictures of your heart. For further information please visit [email or web address] Please follow instruction sheet as given.  Follow-Up: At [facility], you and your health  needs are our priority.  As part of our continuing mission to provide you with exceptional heart care, we have created designated Provider Care Teams.  These Care Teams include your primary Cardiologist (physician) and Advanced Practice Providers (APPs -  Physician Assistants and Nurse Practitioners) who all work together to provide you with the care you need, when you need it.   Your next appointment:   3 month(s)  Provider:   Verne Carrow, MD        Your cardiac CT will be scheduled at one of the below locations:   [facility] [address] [place], [place] [address] [phone]  If scheduled at [facility], please arrive at the [facility]'s Entrance (Entrance C2) of [facility] 30 minutes prior to test start time. You can use the FREE valet parking offered at entrance C (encouraged to control the heart rate for the test)  Proceed to the [facility] Radiology Department (first floor) to check-in and test prep.  All radiology patients and guests should use entrance C2 at [facility], accessed from [facility], even though the hospital's physical address listed is [address]    I  Please follow these instructions carefully (unless otherwise directed):  An IV will be required for this test and Nitroglycerin will be given.  Hold all erectile dysfunction medications at least 3 days (72 hrs) prior to test. (Ie viagra, cialis, sildenafil, tadalafil, etc)   On the Night Before the Test: Be sure to Drink plenty of water. Do not consume any caffeinated/decaffeinated beverages or chocolate 12 hours prior to your test. Do not  take any antihistamines 12 hours prior to your test.  On the Day of the Test: Drink plenty of water until 1 hour prior to the test. Do not eat any food 1 hour prior to test. You may take your regular medications prior to the test.  Take metoprolol (Lopressor) 100 mg two hours prior  to test. Patients who wear a continuous glucose monitor MUST remove the device prior to scanning.       After the Test: Drink plenty of water. After receiving IV contrast, you may experience a mild flushed feeling. This is normal. On occasion, you may experience a mild rash up to 24 hours after the test. This is not dangerous. If this occurs, you can take Benadryl 25 mg and increase your fluid intake. If you experience trouble breathing, this can be serious. If it is severe call 911 IMMEDIATELY. If it is mild, please call our office.  We will call to schedule your test 2-4 weeks out understanding that some insurance companies will need an authorization prior to the service being performed.   For more information and frequently asked questions, please visit our website : [email or web address]  For non-scheduling related questions, please contact the cardiac imaging nurse navigator should you have any questions/concerns: Cardiac Imaging Nurse Navigators Direct Office Dial: [phone]   For scheduling needs, including cancellations and rescheduling, please call [place], [phone].

## 2023-03-14 ENCOUNTER — Telehealth (HOSPITAL_COMMUNITY): Payer: Self-pay | Admitting: *Deleted

## 2023-03-14 NOTE — Telephone Encounter (Signed)
 Reaching out to patient to offer assistance regarding upcoming cardiac imaging study; pt verbalizes understanding of appt date/time, parking situation and where to check in, pre-test NPO status, and verified current allergies; name and call back number provided for further questions should they arise  Chantal Requena RN Navigator Cardiac Imaging [facility] [phone] office [phone] cell  Patient to hold his Zestoretic and take his daily metoprolol  succinate. He is aware to arrive at 11:30 AM.

## 2023-03-16 ENCOUNTER — Ambulatory Visit (HOSPITAL_COMMUNITY)
Admission: RE | Admit: 2023-03-16 | Discharge: 2023-03-16 | Disposition: A | Discharge status: Home / Self Care | Payer: Commercial Managed Care - PPO | Source: Ambulatory Visit | Attending: Cardiovascular Disease | Admitting: Cardiovascular Disease

## 2023-03-16 ENCOUNTER — Ambulatory Visit (HOSPITAL_COMMUNITY): Payer: Commercial Managed Care - PPO

## 2023-03-16 DIAGNOSIS — I25118 Atherosclerotic heart disease of native coronary artery with other forms of angina pectoris: Secondary | ICD-10-CM | POA: Diagnosis present

## 2023-03-16 DIAGNOSIS — R931 Abnormal findings on diagnostic imaging of heart and coronary circulation: Secondary | ICD-10-CM | POA: Diagnosis present

## 2023-03-16 MED ORDER — NITROGLYCERIN 0.4 MG SL SUBL
0.8000 mg | SUBLINGUAL_TABLET | Freq: Once | SUBLINGUAL | Status: AC
Start: 2023-03-16 — End: 2023-03-16
  Administered 2023-03-16: 0.8 mg via SUBLINGUAL

## 2023-03-16 MED ORDER — NITROGLYCERIN 0.4 MG SL SUBL
SUBLINGUAL_TABLET | SUBLINGUAL | Status: AC
  Filled 2023-03-16: qty 2

## 2023-03-16 MED ORDER — IOHEXOL 350 MG/ML SOLN
100.0000 mL | Freq: Once | INTRAVENOUS | Status: AC | PRN
  Administered 2023-03-16: 100 mL via INTRAVENOUS

## 2023-03-19 ENCOUNTER — Ambulatory Visit (HOSPITAL_BASED_OUTPATIENT_CLINIC_OR_DEPARTMENT_OTHER)
Admission: RE | Admit: 2023-03-19 | Discharge: 2023-03-19 | Disposition: A | Discharge status: Home / Self Care | Payer: Commercial Managed Care - PPO | Source: Ambulatory Visit | Attending: Internal Medicine | Admitting: Internal Medicine

## 2023-03-19 ENCOUNTER — Other Ambulatory Visit: Payer: Self-pay | Admitting: Internal Medicine

## 2023-03-19 DIAGNOSIS — R931 Abnormal findings on diagnostic imaging of heart and coronary circulation: Secondary | ICD-10-CM | POA: Diagnosis not present

## 2023-03-19 NOTE — Progress Notes (Signed)
CT sent for FFR 

## 2023-03-23 ENCOUNTER — Ambulatory Visit (HOSPITAL_COMMUNITY): Payer: Commercial Managed Care - PPO | Attending: Internal Medicine

## 2023-03-23 DIAGNOSIS — I25118 Atherosclerotic heart disease of native coronary artery with other forms of angina pectoris: Secondary | ICD-10-CM

## 2023-03-23 LAB — ECHOCARDIOGRAM COMPLETE
Area-P 1/2: 3.31 cm2
S' Lateral: 3 cm

## 2023-03-30 ENCOUNTER — Telehealth: Payer: Self-pay

## 2023-03-30 ENCOUNTER — Telehealth: Payer: Self-pay | Admitting: Cardiovascular Disease

## 2023-03-30 MED ORDER — ASPIRIN 81 MG PO TBEC: 81.0000 mg | DELAYED_RELEASE_TABLET | Freq: Every day | ORAL | Status: AC

## 2023-03-30 NOTE — Telephone Encounter (Signed)
 Pt called to go over echocardiogram and cardiac ct results. The patient has been notified of the result and verbalized understanding.  All questions (if any) were answered. McAlhany suggested the pt start taking ASA 81 mg once daily and continue his statin. Aspirin 81 mg was ordered and sent to the pharmacy.  Erick Alley, RN 03/30/2023 2:06 PM

## 2023-03-30 NOTE — Telephone Encounter (Signed)
 Pt would like a c/b regarding test results. Please advise

## 2023-04-02 ENCOUNTER — Telehealth: Payer: Self-pay

## 2023-04-02 NOTE — Telephone Encounter (Signed)
-----   Message from Verne Carrow sent at 03/19/2023 12:02 PM EST ----- He has some plaque in his coronary arteries but this test suggests that none of it is severe. I would suggest that he start a ASA 81 mg daily and continue the statin for now. I will plan to see back in the office as scheduled in May 2025. Thayer Ohm

## 2023-04-02 NOTE — Telephone Encounter (Signed)
 Called and spoke with patient who verbalized understanding of starting Asprin 81mg  daily and will continue his Rosuvastatin.

## 2023-06-21 ENCOUNTER — Ambulatory Visit: Payer: Commercial Managed Care - PPO | Admitting: Cardiovascular Disease

## 2023-07-04 ENCOUNTER — Encounter: Payer: Self-pay | Admitting: Neurology

## 2023-07-04 ENCOUNTER — Ambulatory Visit: Payer: Commercial Managed Care - PPO | Admitting: Neurology

## 2023-07-04 VITALS — BP 138/72 | HR 93 | Ht 76.0 in | Wt 230.0 lb

## 2023-07-04 DIAGNOSIS — E639 Nutritional deficiency, unspecified: Secondary | ICD-10-CM

## 2023-07-04 DIAGNOSIS — G621 Alcoholic polyneuropathy: Secondary | ICD-10-CM

## 2023-07-04 DIAGNOSIS — R292 Abnormal reflex: Secondary | ICD-10-CM | POA: Diagnosis not present

## 2023-07-04 MED ORDER — GABAPENTIN 600 MG PO TABS: 600.0000 mg | ORAL_TABLET | Freq: Two times a day (BID) | ORAL | 3 refills | Status: AC

## 2023-07-04 NOTE — Progress Notes (Signed)
 Follow-up Visit   Date: 07/04/23   Timothy Gainer Sr. MRN: [id number] DOB: [birth year]   Interim History: Timothy Gainer Sr. is a 63 y.o. right-handed male with COPD, tobacco use (1PPD x 45 + years), hypertension, and hyperlipidemia returning to the clinic for follow-up of alcohol-induced neuropathy.  The patient was accompanied to the clinic by self.  IMPRESSION/PLAN: Alcohol-induced neuropathy, stable.  He continues to consume 3-6 beers nightly.  - Continue gabapentin  600mg  BID  - Counseled on reducing, if not stopping, alcohol  2.  Nutritional deficiency contributing to neuropathy - vitamin B12, thiamine, and folic acid   - Continue vitamin B12 1000mcg daily, vitamin B1 100mg , and folic acid 1mg  daily  3.  Hyperreflexia in the legs.  He may have lumbar canal stenosis, but is asymptomatic.  He denies exertional leg weakness or radicular leg pain.  - Low threshold to get MRI lumbar spine if he develops symptoms  Return to clinic in 1 year  ---------------------------------------------------- History of present illness: For the past 2-3 years, he has constant numbness/tingling involving the soles of the feet.  No associated pain.  He takes gabapentin  100mg  three times daily which may have helped some.  He has some back pain with prolonged standing.  No weakness in the legs.  He has some imbalance, no falls, and walks unassisted. NCS/EMG performed by Dr. 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  He drinks 3-6 beers daily for many years.  His labs indicated borderline-normal vitamin B12 and folate.  He is not on supplement or daily vitamin.   UPDATE 04/15/2021:  He is here for follow-up visit.  He is abrasive today and frustrated that no one can fix his neuropathy and despite taking medication, such as gabapentin  300mg -300mg -200mg , he gets no relief. He  has found the most benefit with wearing compression stockings and applying vicks vaporub to his feet.  He has no interest in reducing or abstaining from alcohol consumption.  He takes vitamin B12 and B1 supplement, but did not start folic acid.    UPDATE 06/28/2022:  He is here is follow-up visit. There has been no significant change in his neuropathy.  He feels that the right foot is worse.  He is taking gabapentin  600mg  twice daily which helps some.  He gets the most benefit with vicksvaporub.  He does not like to take medications and prefers natural options. He continues to drink 3-6 beers nightly and does not think alcohol is contributing to his neuropathy.   UPDATE 07/04/2023:  He is here for follow-up visit.  He has retired from [employer] in May 2025.  He reports stable symptoms of neuropathy and low back pain.  He continues to take gabapentin  600mg  twice daily which mostly controls the tingling.  He has worsening symptoms when the weather changes. He denies any falls.    Medications:  Current Outpatient Medications on File Prior to Visit  Medication Sig Dispense Refill   aspirin  EC 81 MG tablet Take 1 tablet (81 mg total) by mouth daily. Swallow whole.     EPINEPHrine  0.3 mg/0.3 mL IJ SOAJ injection Inject 0.3 mg into the muscle as needed for anaphylaxis.     gabapentin  (NEURONTIN ) 600 MG tablet Take 1 tablet (600 mg total) by mouth 2 (two) times daily. 180 tablet 3   lisinopril-hydrochlorothiazide (ZESTORETIC) 20-12.5 MG tablet Take 1 tablet by mouth 2 (two)  times daily.     metoprolol  succinate (TOPROL -XL) 50 MG 24 hr tablet Take 50 mg by mouth daily.     metoprolol  tartrate (LOPRESSOR ) 100 MG tablet Take 1 tablet (100 mg total) by mouth once for 1 dose. Take 90-120 minutes prior to scan. Hold for SBP less than 110. 1 tablet 0   rosuvastatin (CRESTOR) 10 MG tablet Take by mouth.     tiotropium (SPIRIVA) 18 MCG inhalation capsule Place 18 mcg into inhaler and inhale daily.     No current  facility-administered medications on file prior to visit.    Allergies:  Allergies  Allergen Reactions   Bee Venom     Vital Signs:  BP 138/72 (BP Location: Left Arm, Patient Position: Sitting)   Pulse 93   Ht 6\' 4"  (1.93 m)   Wt 230 lb (104.3 kg)   SpO2 93%   BMI 28.00 kg/m    Neurological Exam: MENTAL STATUS including orientation to time, place, person, recent and remote memory, attention span and concentration, language, and fund of knowledge is normal.  Speech is not dysarthric.  CRANIAL NERVES:   Normal conjugate, extra-ocular eye movements in all directions of gaze.  No ptosis. Face is symmetric.   MOTOR:  Motor strength is 5/5 throughout.  MSRs:  Reflexes are 2+/4 throughout, except 3+/4 bilateral patella.  SENSORY:  Reduced vibration distally in the feet  COORDINATION/GAIT:  Gait is mildly wide-based, stable, unassisted.  Unable to perform tandem gait.   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   Labs 08/24/2020: vitamin B12 239, folate 3.5 Labs 12/10/2020:  vitamin B12 143*, folate 4.9*, vitamin B1 < 6*, TSH 2.37, SPEP with IFE no M protein    Thank you for allowing me to participate in patient's care.  If I can answer any additional questions, I would be pleased to do so.    Sincerely,    Laiya Wisby K. Lydia Sams, DO

## 2023-07-19 ENCOUNTER — Ambulatory Visit: Attending: Cardiovascular Disease | Admitting: Cardiovascular Disease

## 2023-07-19 ENCOUNTER — Encounter: Payer: Self-pay | Admitting: Cardiovascular Disease

## 2023-07-19 VITALS — BP 134/60 | HR 59 | Ht 75.5 in | Wt 225.6 lb

## 2023-07-19 DIAGNOSIS — I25118 Atherosclerotic heart disease of native coronary artery with other forms of angina pectoris: Secondary | ICD-10-CM | POA: Diagnosis not present

## 2023-07-19 NOTE — Progress Notes (Signed)
 Chief Complaint  Patient presents with   Follow-up    CAD   History of Present Illness: 63 yo male with history CAD, COPD, etoh abuse, HTN, HLD and tobacco abuse who is here today for follow up. He was seen for the first time in our office in January 2025 for the evaluation of finding of CAD on chest CT. Chest CT for lung screening in 2021, 2022, 2023 and 2024 with evidence of coronary artery calcification/atherosclerosis. He reported rare chest pains and baseline moderate dyspnea with exertion. Cardiac CTA February 2025 with coronary calcium score of 1278, moderate disease in all three vessels with negative CT FFR suggesting the plaque was not flow limiting. Echo February 2025 with LVEF=60-65%. No significant valve disease.   He is here today for follow up. The patient denies any chest pain, dyspnea, palpitations, lower extremity edema, orthopnea, PND, dizziness, near syncope or syncope.   Primary Care Physician: Diamond Formica, PA   Past Medical History:  Diagnosis Date   Atherosclerosis    B12 deficiency    CAD (coronary artery disease)    COPD (chronic obstructive pulmonary disease) (HCC)    Dupuytren's contracture of right hand    ED (erectile dysfunction)    Emphysema, unspecified (HCC)    ETOH abuse    Folate deficiency    History of colon polyps    Hypercholesterolemia    Hypertension    Neuropathy    Smoker    Tobacco dependence     Past Surgical History:  Procedure Laterality Date   DUPUYTREN CONTRACTURE RELEASE Right 02/13/2020   Procedure: DUPUYTREN CONTRACTURE RELEASE RIGHT PALM AND RIGHT RING FINGER AND POSSIBLE MIDDLE FINGER;  Surgeon: Lyanne Sample, MD;  Location: [facility];  Service: Orthopedics;  Laterality: Right;  AXILLARY BLOCK   DUPUYTREN CONTRACTURE RELEASE Left 01/25/2023   Procedure: excision Dupuytrens contracture involving left palm left ring finger and thumb and index web space;  Surgeon: Brunilda Capra, MD;  Location: [facility];  Service: Orthopedics;  Laterality: Left;   SPLENECTOMY, TOTAL     at 63 years of age s/p fall from building    Current Outpatient Medications  Medication Sig Dispense Refill   aspirin  EC 81 MG tablet Take 1 tablet (81 mg total) by mouth daily. Swallow whole.     clotrimazole-betamethasone (LOTRISONE) cream Apply topically 2 (two) times daily.     EPINEPHrine  0.3 mg/0.3 mL IJ SOAJ injection Inject 0.3 mg into the muscle as needed for anaphylaxis.     gabapentin  (NEURONTIN ) 600 MG tablet Take 1 tablet (600 mg total) by mouth 2 (two) times daily. 180 tablet 3   lisinopril-hydrochlorothiazide (ZESTORETIC) 20-12.5 MG tablet Take 1 tablet by mouth 2 (two) times daily.     metoprolol  succinate (TOPROL -XL) 50 MG 24 hr tablet Take 50 mg by mouth daily.     rosuvastatin (CRESTOR) 10 MG tablet Take by mouth.     tiotropium (SPIRIVA) 18 MCG inhalation capsule Place 18 mcg into inhaler and inhale daily.     metoprolol  tartrate (LOPRESSOR ) 100 MG tablet Take 1 tablet (100 mg total) by mouth once for 1 dose. Take 90-120 minutes prior to scan. Hold for SBP less than 110. 1 tablet 0   No current facility-administered medications for this visit.    Allergies  Allergen Reactions   Bee Venom     Social History   Socioeconomic History   Marital status: Divorced    Spouse name: Not on file  Number of children: 4   Years of education: Not on file   Highest education level: Not on file  Occupational History   Occupation: Merchandiser, retail for [employer]  Tobacco Use   Smoking status: Every Day    Current packs/day: 1.00    Types: Cigarettes   Smokeless tobacco: Never  Vaping Use   Vaping status: Never Used  Substance and Sexual Activity   Alcohol use: Yes    Alcohol/week: 12.0 standard drinks of alcohol    Types: 12 Cans of beer per week    Comment: daily beer depends   Drug use: Yes    Frequency: 1.0 times per week    Types: Marijuana    Comment: smokes marijuana weekly   Sexual  activity: Not on file  Other Topics Concern   Not on file  Social History Narrative   Right Handed    Lives in a one story home    Social Drivers of Health   Financial Resource Strain: Not on file  Food Insecurity: Low Risk  (02/16/2023)   Received from [facility]   Hunger Vital Sign    Worried About Running Out of Food in the Last Year: Never true    Ran Out of Food in the Last Year: Never true  Transportation Needs: No Transportation Needs (02/16/2023)   Received from [facility]    In the past 12 months, has lack of reliable transportation kept you from medical appointments, meetings, work or from getting things needed for daily living? : No  Physical Activity: Not on file  Stress: Not on file  Social Connections: Not on file  Intimate Partner Violence: Not on file    Family History  Problem Relation Age of Onset   Breast cancer Mother    Hypertension Mother    Hypertension Father    Dementia Father    Parkinson's disease Father     Review of Systems:  As stated in the HPI and otherwise negative.   BP 134/60 (BP Location: Left Arm, Patient Position: Sitting)   Pulse (!) 59   Ht 6' 3.5 (1.918 m)   Wt 225 lb 9.6 oz (102.3 kg)   SpO2 94%   BMI 27.83 kg/m   Physical Examination: General: Well developed, well nourished, NAD  HEENT: OP clear, mucus membranes moist  SKIN: warm, dry. No rashes. Neuro: No focal deficits  Musculoskeletal: Muscle strength 5/5 all ext  Psychiatric: Mood and affect normal  Neck: No JVD, no carotid bruits, no thyromegaly, no lymphadenopathy.  Lungs:Clear bilaterally, no wheezes, rhonci, crackles Cardiovascular: Regular rate and rhythm. No murmurs, gallops or rubs. Abdomen:Soft. Bowel sounds present. Non-tender.  Extremities: No lower extremity edema. Pulses are 2 + in the bilateral DP/PT.  EKG:  EKG is not ordered today. The ekg ordered today demonstrates  Recent Labs: 01/19/2023: ALT 52 03/01/2023: BUN 7;  Creatinine, Ser 1.00; Potassium 4.8; Sodium 141   Lipid Panel No results found for: CHOL, TRIG, HDL, CHOLHDL, VLDL, LDLCALC, LDLDIRECT   Wt Readings from Last 3 Encounters:  07/19/23 225 lb 9.6 oz (102.3 kg)  07/04/23 230 lb (104.3 kg)  03/01/23 229 lb (103.9 kg)    Assessment and Plan:   1. CAD with angina: Moderate CAD by coronary CTA February 2025 with negative CT FFR. No chest pain. He stopped his Crestor due to back aches. His LDL in May 2025 was 134. He is now back on Crestor. He has repeat lipids in primary care in 2  months. If he does tolerate statins, can consider Repatha or Praluent.   Labs/ tests ordered today include:  No orders of the defined types were placed in this encounter.  Disposition:   F/U with me in 12 months  Signed, Antoinette Batman, MD, [facility] 07/19/2023 10:45 AM    [facility] [address] [place], [place], [place]  [address] Phone: [phone]; Fax: [phone]

## 2023-07-19 NOTE — Patient Instructions (Signed)
 Medication Instructions:  No changes *If you need a refill on your cardiac medications before your next appointment, please call your pharmacy*  Lab Work: none If you have labs (blood work) drawn today and your tests are completely normal, you will receive your results only by: MyChart Message (if you have MyChart) OR A paper copy in the mail If you have any lab test that is abnormal or we need to change your treatment, we will call you to review the results.  Testing/Procedures: none  Follow-Up: At [facility], you and your health needs are our priority.  As part of our continuing mission to provide you with exceptional heart care, our providers are all part of one team.  This team includes your primary Cardiologist (physician) and Advanced Practice Providers or APPs (Physician Assistants and Nurse Practitioners) who all work together to provide you with the care you need, when you need it.  Your next appointment:   12 month(s)  Provider:   Antoinette Batman, MD    We recommend signing up for the patient portal called "MyChart".  Sign up information is provided on this After Visit Summary.  MyChart is used to connect with patients for Virtual Visits (Telemedicine).  Patients are able to view lab/test results, encounter notes, upcoming appointments, etc.  Non-urgent messages can be sent to your provider as well.   To learn more about what you can do with MyChart, go to [email or web address].   Other Instructions

## 2023-08-09 DIAGNOSIS — L309 Dermatitis, unspecified: Secondary | ICD-10-CM | POA: Diagnosis not present

## 2023-08-09 DIAGNOSIS — I2510 Atherosclerotic heart disease of native coronary artery without angina pectoris: Secondary | ICD-10-CM | POA: Diagnosis not present

## 2023-08-09 DIAGNOSIS — L209 Atopic dermatitis, unspecified: Secondary | ICD-10-CM | POA: Diagnosis not present

## 2023-08-09 DIAGNOSIS — F17210 Nicotine dependence, cigarettes, uncomplicated: Secondary | ICD-10-CM | POA: Diagnosis not present

## 2023-08-09 DIAGNOSIS — J449 Chronic obstructive pulmonary disease, unspecified: Secondary | ICD-10-CM | POA: Diagnosis not present

## 2023-08-09 DIAGNOSIS — E7800 Pure hypercholesterolemia, unspecified: Secondary | ICD-10-CM | POA: Diagnosis not present

## 2023-08-09 DIAGNOSIS — G629 Polyneuropathy, unspecified: Secondary | ICD-10-CM | POA: Diagnosis not present

## 2023-08-09 DIAGNOSIS — I10 Essential (primary) hypertension: Secondary | ICD-10-CM | POA: Diagnosis not present

## 2023-08-24 ENCOUNTER — Other Ambulatory Visit: Payer: Self-pay | Admitting: Physician Assistant

## 2023-08-24 ENCOUNTER — Encounter: Payer: Self-pay | Admitting: Physician Assistant

## 2023-08-24 DIAGNOSIS — M79604 Pain in right leg: Secondary | ICD-10-CM

## 2023-08-27 ENCOUNTER — Ambulatory Visit
Admission: RE | Admit: 2023-08-27 | Discharge: 2023-08-27 | Disposition: A | Discharge status: Home / Self Care | Source: Ambulatory Visit | Attending: Physician Assistant | Admitting: Physician Assistant

## 2023-08-27 DIAGNOSIS — M79604 Pain in right leg: Secondary | ICD-10-CM

## 2023-08-27 DIAGNOSIS — I70213 Atherosclerosis of native arteries of extremities with intermittent claudication, bilateral legs: Secondary | ICD-10-CM | POA: Diagnosis not present

## 2023-09-26 DIAGNOSIS — L309 Dermatitis, unspecified: Secondary | ICD-10-CM | POA: Diagnosis not present

## 2023-09-26 DIAGNOSIS — M79671 Pain in right foot: Secondary | ICD-10-CM | POA: Diagnosis not present

## 2023-09-26 DIAGNOSIS — L0390 Cellulitis, unspecified: Secondary | ICD-10-CM | POA: Diagnosis not present

## 2023-10-01 ENCOUNTER — Encounter: Payer: Self-pay | Admitting: Cardiovascular Disease

## 2023-10-01 ENCOUNTER — Telehealth: Payer: Self-pay | Admitting: Cardiovascular Disease

## 2023-10-01 NOTE — Telephone Encounter (Signed)
 DOT letter prepared by Dr. Verlin.  Call patient.  He will come by to pick up today.  ;

## 2023-10-01 NOTE — Telephone Encounter (Signed)
 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

## 2023-10-17 DIAGNOSIS — Z23 Encounter for immunization: Secondary | ICD-10-CM | POA: Diagnosis not present

## 2023-10-17 DIAGNOSIS — G629 Polyneuropathy, unspecified: Secondary | ICD-10-CM | POA: Diagnosis not present

## 2023-10-17 DIAGNOSIS — L309 Dermatitis, unspecified: Secondary | ICD-10-CM | POA: Diagnosis not present

## 2023-10-17 DIAGNOSIS — E7800 Pure hypercholesterolemia, unspecified: Secondary | ICD-10-CM | POA: Diagnosis not present

## 2023-10-17 DIAGNOSIS — I10 Essential (primary) hypertension: Secondary | ICD-10-CM | POA: Diagnosis not present

## 2023-11-12 DIAGNOSIS — I872 Venous insufficiency (chronic) (peripheral): Secondary | ICD-10-CM | POA: Diagnosis not present

## 2023-11-12 DIAGNOSIS — Q800 Ichthyosis vulgaris: Secondary | ICD-10-CM | POA: Diagnosis not present

## 2023-11-15 ENCOUNTER — Other Ambulatory Visit: Payer: Self-pay | Admitting: Acute Care

## 2023-11-15 DIAGNOSIS — Z87891 Personal history of nicotine dependence: Secondary | ICD-10-CM

## 2023-11-15 DIAGNOSIS — Z122 Encounter for screening for malignant neoplasm of respiratory organs: Secondary | ICD-10-CM

## 2023-11-15 DIAGNOSIS — F17210 Nicotine dependence, cigarettes, uncomplicated: Secondary | ICD-10-CM

## 2023-11-23 ENCOUNTER — Ambulatory Visit
Admission: RE | Admit: 2023-11-23 | Discharge: 2023-11-23 | Disposition: A | Discharge status: Home / Self Care | Source: Ambulatory Visit

## 2023-11-23 DIAGNOSIS — Z122 Encounter for screening for malignant neoplasm of respiratory organs: Secondary | ICD-10-CM

## 2023-11-23 DIAGNOSIS — Z87891 Personal history of nicotine dependence: Secondary | ICD-10-CM

## 2023-11-23 DIAGNOSIS — F17210 Nicotine dependence, cigarettes, uncomplicated: Secondary | ICD-10-CM | POA: Diagnosis not present

## 2023-11-29 ENCOUNTER — Telehealth: Payer: Self-pay | Admitting: Acute Care

## 2023-11-29 ENCOUNTER — Other Ambulatory Visit: Payer: Self-pay

## 2023-11-29 DIAGNOSIS — Z87891 Personal history of nicotine dependence: Secondary | ICD-10-CM

## 2023-11-29 DIAGNOSIS — F17210 Nicotine dependence, cigarettes, uncomplicated: Secondary | ICD-10-CM

## 2023-11-29 DIAGNOSIS — Z122 Encounter for screening for malignant neoplasm of respiratory organs: Secondary | ICD-10-CM

## 2023-11-29 NOTE — Telephone Encounter (Signed)
 Spoke with patient and reviewed recent Lung CT Results. No questions.

## 2023-11-29 NOTE — Telephone Encounter (Signed)
 Pt called and would like to get the results of the CT scan that was done on 11/23/23. Thanks

## 2023-12-20 DIAGNOSIS — I10 Essential (primary) hypertension: Secondary | ICD-10-CM | POA: Diagnosis not present

## 2023-12-20 DIAGNOSIS — F1990 Other psychoactive substance use, unspecified, uncomplicated: Secondary | ICD-10-CM | POA: Diagnosis not present

## 2023-12-20 DIAGNOSIS — Z125 Encounter for screening for malignant neoplasm of prostate: Secondary | ICD-10-CM | POA: Diagnosis not present

## 2023-12-20 DIAGNOSIS — N529 Male erectile dysfunction, unspecified: Secondary | ICD-10-CM | POA: Diagnosis not present

## 2023-12-20 DIAGNOSIS — J449 Chronic obstructive pulmonary disease, unspecified: Secondary | ICD-10-CM | POA: Diagnosis not present

## 2023-12-20 DIAGNOSIS — F17210 Nicotine dependence, cigarettes, uncomplicated: Secondary | ICD-10-CM | POA: Diagnosis not present

## 2023-12-20 DIAGNOSIS — Z23 Encounter for immunization: Secondary | ICD-10-CM | POA: Diagnosis not present

## 2023-12-20 DIAGNOSIS — Z0000 Encounter for general adult medical examination without abnormal findings: Secondary | ICD-10-CM | POA: Diagnosis not present

## 2023-12-20 DIAGNOSIS — L309 Dermatitis, unspecified: Secondary | ICD-10-CM | POA: Diagnosis not present

## 2023-12-20 DIAGNOSIS — G621 Alcoholic polyneuropathy: Secondary | ICD-10-CM | POA: Diagnosis not present

## 2023-12-20 DIAGNOSIS — I2510 Atherosclerotic heart disease of native coronary artery without angina pectoris: Secondary | ICD-10-CM | POA: Diagnosis not present

## 2024-07-07 ENCOUNTER — Ambulatory Visit: Admitting: Neurology
